# Patient Record
Sex: Male | Born: 1980
Health system: Southern US, Community
[De-identification: ages and names within clinical notes are randomized; demographics above are authoritative.]

## PROBLEM LIST (undated history)

## (undated) ENCOUNTER — Ambulatory Visit: Admission: EM | Payer: BC Managed Care – PPO | Source: Home / Self Care

## (undated) DIAGNOSIS — Z8709 Personal history of other diseases of the respiratory system: Secondary | ICD-10-CM

## (undated) DIAGNOSIS — J302 Other seasonal allergic rhinitis: Secondary | ICD-10-CM

## (undated) DIAGNOSIS — K589 Irritable bowel syndrome without diarrhea: Secondary | ICD-10-CM

## (undated) DIAGNOSIS — K5900 Constipation, unspecified: Secondary | ICD-10-CM

## (undated) DIAGNOSIS — E7800 Pure hypercholesterolemia, unspecified: Secondary | ICD-10-CM

## (undated) DIAGNOSIS — K649 Unspecified hemorrhoids: Secondary | ICD-10-CM

## (undated) DIAGNOSIS — L309 Dermatitis, unspecified: Secondary | ICD-10-CM

## (undated) DIAGNOSIS — E785 Hyperlipidemia, unspecified: Secondary | ICD-10-CM

## (undated) DIAGNOSIS — F988 Other specified behavioral and emotional disorders with onset usually occurring in childhood and adolescence: Secondary | ICD-10-CM

## (undated) HISTORY — DX: Personal history of other diseases of the respiratory system: Z87.09

## (undated) HISTORY — DX: Other specified behavioral and emotional disorders with onset usually occurring in childhood and adolescence: F98.8

## (undated) HISTORY — DX: Other seasonal allergic rhinitis: J30.2

## (undated) HISTORY — DX: Constipation, unspecified: K59.00

## (undated) HISTORY — DX: Pure hypercholesterolemia, unspecified: E78.00

## (undated) HISTORY — DX: Irritable bowel syndrome, unspecified: K58.9

## (undated) HISTORY — DX: Dermatitis, unspecified: L30.9

---
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
## 2002-01-20 ENCOUNTER — Emergency Department (HOSPITAL_COMMUNITY): Admission: EM | Admit: 2002-01-20 | Discharge: 2002-01-20 | Payer: Self-pay | Admitting: Emergency Medicine

## 2004-03-28 ENCOUNTER — Emergency Department (HOSPITAL_COMMUNITY): Admission: EM | Admit: 2004-03-28 | Discharge: 2004-03-28 | Payer: Self-pay | Admitting: Emergency Medicine

## 2004-03-29 ENCOUNTER — Ambulatory Visit (HOSPITAL_COMMUNITY): Admission: RE | Admit: 2004-03-29 | Discharge: 2004-03-29 | Payer: Self-pay | Admitting: Emergency Medicine

## 2004-03-31 ENCOUNTER — Emergency Department (HOSPITAL_COMMUNITY): Admission: EM | Admit: 2004-03-31 | Discharge: 2004-03-31 | Payer: Self-pay | Admitting: Emergency Medicine

## 2010-08-20 ENCOUNTER — Ambulatory Visit: Payer: Self-pay | Admitting: Family Medicine

## 2010-08-20 DIAGNOSIS — J029 Acute pharyngitis, unspecified: Secondary | ICD-10-CM | POA: Insufficient documentation

## 2010-12-31 NOTE — Assessment & Plan Note (Signed)
Summary: NOV: ST   Vital Signs:  Patient profile:   30 year old male Height:      73 inches Weight:      269 pounds BMI:     35.62 Temp:     99.0 degrees F oral BP sitting:   130 / 79  (right arm) Cuff size:   small  Vitals Entered By: Avon Gully CMA, Duncan Dull) (August 20, 2010 11:25 AM)  Contraindications/Deferment of Procedures/Staging:    Test/Procedure: FLU VAX    Reason for deferment: patient declined  CC: sore throat since sat, coughing   CC:  sore throat since sat and coughing.  History of Present Illness: Started with ST about 4 days. Taking halls lozenges but not really helping. Taking nasonex and claritin but not really helping. Not painful to eat or swallow.  This am was painfult to swallow. + fever and hot flashes. No GI sxs. No known sick contacts.  Sinsus pressure and pina.  Hx of allergies. Also tried mucinex adn some post nasal drip. N runny nose. Clear nasal discharge.   Habits & Providers  Alcohol-Tobacco-Diet     Alcohol drinks/day: <1     Tobacco Status: never  Exercise-Depression-Behavior     Does Patient Exercise: no     Type of exercise: running     STD Risk: never     Drug Use: no     Seat Belt Use: always  Current Medications (verified): 1)  Nasonex 50 Mcg/act Susp (Mometasone Furoate) .... One or Two Sprays Each Nostril  Allergies (verified): No Known Drug Allergies  Comments:  Nurse/Medical Assistant: The patient's medications and allergies were reviewed with the patient and were updated in the Medication and Allergy Lists. Avon Gully CMA, Duncan Dull) (August 20, 2010 11:26 AM)  Past History:  Past Medical History: Hx of nasal polyps.   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  HS degree. married to [place] with one daughteer, Zollie Scale.   Never Smoked Alcohol use-yes Drug use-no Regular exercise-no 1-2 caffeinated drinks per day. Smoking Status:   never Does Patient Exercise:  no STD Risk:  never Drug Use:  no Seat Belt Use:  always  Physical Exam  General:  Well-developed,well-nourished,in no acute distress; alert,appropriate and cooperative throughout examination Head:  Normocephalic and atraumatic without obvious abnormalities. No apparent alopecia or balding. Eyes:  No corneal or conjunctival inflammation noted. EOMI. Perrla.  Ears:  External ear exam shows no significant lesions or deformities.  Otoscopic examination reveals clear canals, tympanic membranes are intact bilaterally without bulging, retraction, inflammation or discharge. Hearing is grossly normal bilaterally. Nose:  External nasal examination shows no deformity or inflammation. Nasal mucosa are pink and moist without lesions or exudates. Turbinates are pale and swollen.  Mouth:  Oral mucosa and oropharynx without lesions or exudates.  Teeth in good repair. Neck:  No deformities, masses, or tenderness noted. Lungs:  Normal respiratory effort, chest expands symmetrically. Lungs are clear to auscultation, no crackles or wheezes. Heart:  Normal rate and regular rhythm. S1 and S2 normal without gallop, murmur, click, rub or other extra sounds. Skin:  no rashes.  no rashes.   Cervical Nodes:  No lymphadenopathy noted Psych:  Cognition and judgment appear intact. Alert and cooperative with normal attention span and concentration. No apparent delusions, illusions, hallucinations   Impression & Recommendations:  Problem # 1:  PHARYNGITIS (ICD-462)  Sterp negative. Likely URI. If not better in one week  or getting worse then let me know and will tx for sinusitis since having sinus pain adn pressure. Hx of allergies and  nasal polyps. Can restart your allergy medications. Trial of the magic mouthwash for pain relief. Says his ST is keeping him awake at night.   Orders: Rapid Strep (47829)  Complete Medication List: 1)  Nasonex 50 Mcg/act Susp (Mometasone furoate) ....  One or two sprays each nostril 2)  Magic Mouth Wash.  .... Swish and gargle 5-10 ml  3 x day .  Patient Instructions: 1)  Call if not better by the end of the week.  Prescriptions: MAGIC MOUTH WASH. Swish and gargle 5-10 ml  3 x day .  #120 ml x 0   Entered and Authorized by:   Nani Gasser MD   Signed by:   Nani Gasser MD on 08/20/2010   Method used:   Printed then faxed to ...       [facility] [address] (retail)       [phone]       [place], [place]  [address]       Ph: [phone]       Fax: [phone]   RxID:   [id number]   Laboratory Results  Date/Time Received: 08/20/10 Date/Time Reported: 08/20/10  Other Tests  Rapid Strep: negative

## 2011-11-03 ENCOUNTER — Ambulatory Visit (INDEPENDENT_AMBULATORY_CARE_PROVIDER_SITE_OTHER): Payer: Self-pay | Admitting: Family Medicine

## 2011-11-03 DIAGNOSIS — Z23 Encounter for immunization: Secondary | ICD-10-CM

## 2011-11-03 NOTE — Progress Notes (Signed)
  Subjective:    Patient ID: Devin Osborne, male    DOB: 1981-09-08, 30 y.o.   MRN: [id number]  HPI   Here for flu shot Review of Systems     Objective:   Physical Exam        Assessment & Plan:

## 2012-01-20 ENCOUNTER — Telehealth: Payer: Self-pay | Admitting: *Deleted

## 2012-01-20 NOTE — Telephone Encounter (Signed)
A Rep from [employer] called and states DR. Lesly Rubenstein wants to speak with you in re results. [phone]

## 2012-01-21 NOTE — Telephone Encounter (Signed)
Please call the patient and I did review his oximetry results. Overall he has borderline sleep apnea but doesn't quite meet the definition. At this point time I recommend weight loss and this should improve the snoring. This would certainly get his RDI blood 5 to put him back into the completely normal range. If he is still having some concerns about snoring and we could consider an ear nose and throat evaluation if he would like.

## 2012-02-02 ENCOUNTER — Encounter: Payer: Self-pay | Admitting: Family Medicine

## 2013-03-07 ENCOUNTER — Encounter: Payer: Self-pay | Admitting: Family Medicine

## 2013-03-07 ENCOUNTER — Ambulatory Visit (INDEPENDENT_AMBULATORY_CARE_PROVIDER_SITE_OTHER): Payer: BC Managed Care – PPO | Admitting: Family Medicine

## 2013-03-07 VITALS — BP 126/73 | HR 78 | Temp 98.2°F | Wt 269.0 lb

## 2013-03-07 DIAGNOSIS — Z87898 Personal history of other specified conditions: Secondary | ICD-10-CM

## 2013-03-07 DIAGNOSIS — Z13220 Encounter for screening for lipoid disorders: Secondary | ICD-10-CM

## 2013-03-07 DIAGNOSIS — Z8709 Personal history of other diseases of the respiratory system: Secondary | ICD-10-CM

## 2013-03-07 DIAGNOSIS — J302 Other seasonal allergic rhinitis: Secondary | ICD-10-CM

## 2013-03-07 DIAGNOSIS — L259 Unspecified contact dermatitis, unspecified cause: Secondary | ICD-10-CM

## 2013-03-07 DIAGNOSIS — J309 Allergic rhinitis, unspecified: Secondary | ICD-10-CM

## 2013-03-07 DIAGNOSIS — L309 Dermatitis, unspecified: Secondary | ICD-10-CM | POA: Insufficient documentation

## 2013-03-07 DIAGNOSIS — Z131 Encounter for screening for diabetes mellitus: Secondary | ICD-10-CM

## 2013-03-07 HISTORY — DX: Other seasonal allergic rhinitis: J30.2

## 2013-03-07 HISTORY — DX: Dermatitis, unspecified: L30.9

## 2013-03-07 HISTORY — DX: Personal history of other diseases of the respiratory system: Z87.09

## 2013-03-07 MED ORDER — TRIAMCINOLONE 0.1 % CREAM:EUCERIN CREAM 1:1: 1.0000 "application " | TOPICAL_CREAM | Freq: Two times a day (BID) | CUTANEOUS | Status: DC | PRN

## 2013-03-07 MED ORDER — CETIRIZINE HCL 10 MG PO TABS: 10.0000 mg | ORAL_TABLET | Freq: Every day | ORAL | Status: DC

## 2013-03-07 MED ORDER — MOMETASONE FUROATE 50 MCG/ACT NA SUSP: NASAL | Status: DC

## 2013-03-07 NOTE — Progress Notes (Signed)
CC: Devin Osborne is a 32 y.o. male is here for Allergies   Subjective: HPI:  Patient complains of worsening seasonal allergies. Typically worse  in the spring however the past 2 years symptoms have been present to a mild degree year round. They're now currently of moderate severity. Described as itching eyes, watery eyes, nasal congestion, for head pressure, runny nose. Symptoms are slightly improved with generic Zyrtec. Symptoms are worse when trees start blooming. Symptoms are present on a daily basis. History of seeing ear nose and throat for a polyp in the sinus but lost to followup due to financial reasons.  Patient complains of long-standing eczema. Symptoms seemed to get worse in the winter, he occasionally gets flares other times of the year. He has no symptoms at the current time. He denies rashes or skin discoloration.  Review of Systems - General ROS: negative for - chills, fever, night sweats, weight gain or weight loss Ophthalmic ROS: negative for - decreased vision Psychological ROS: negative for - anxiety or depression ENT ROS: negative for - hearing change or tinnitus Hematological and Lymphatic ROS: negative for - bleeding problems, bruising or swollen lymph nodes Breast ROS: negative Respiratory ROS: no cough, shortness of breath, or wheezing Cardiovascular ROS: no chest pain or dyspnea on exertion Gastrointestinal ROS: no abdominal pain, change in bowel habits, or black or bloody stools Genito-Urinary ROS: negative for - genital discharge, genital ulcers, incontinence or abnormal bleeding from genitals Musculoskeletal ROS: negative for - joint pain or muscle pain Neurological ROS: negative for - headaches or memory loss Dermatological ROS: negative for lumps, mole changes, rash and skin lesion changes  Past Medical History  Diagnosis Date  . Seasonal allergies 03/07/2013  . History of nasal polyp 03/07/2013  . Eczema 03/07/2013     Family History  Problem Relation  Age of Onset  . Hypertension Mother   . Bipolar disorder Mother   . Hyperlipidemia Father   . Asthma Father      History  Substance Use Topics  . Smoking status: Not on file  . Smokeless tobacco: Not on file  . Alcohol Use: Not on file     Objective: Filed Vitals:   03/07/13 1632  BP: 126/73  Pulse: 78  Temp: 98.2 F (36.8 C)    General: Alert and Oriented, No Acute Distress HEENT: Pupils equal, round, reactive to light. Conjunctivae clear.  External ears unremarkable, canals clear with intact TMs with appropriate landmarks.  Middle ear appears open without effusion. Boggy inferior turbinates bilaterally.  Moist mucous membranes, pharynx without inflammation nor lesions.  Neck supple without palpable lymphadenopathy nor abnormal masses. Lungs: Clear to auscultation bilaterally, no wheezing/ronchi/rales.  Comfortable work of breathing. Good air movement. Cardiac: Regular rate and rhythm. Normal S1/S2.  No murmurs, rubs, nor gallops.   Extremities: No peripheral edema.  Strong peripheral pulses.  Mental Status: No depression, anxiety, nor agitation. Skin: Warm and dry. No eczematous changes  Assessment & Plan: Devin Osborne was seen today for allergies.  Diagnoses and associated orders for this visit:  Eczema - Triamcinolone Acetonide (TRIAMCINOLONE 0.1 % CREAM : EUCERIN) CREA; Apply 1 application topically 2 (two) times daily as needed.  Seasonal allergies - mometasone (NASONEX) 50 MCG/ACT nasal spray; Two sprays each nostril daily to prevent allergies. - cetirizine (ZYRTEC ALLERGY) 10 MG tablet; Take 1 tablet (10 mg total) by mouth daily.  History of nasal polyp - mometasone (NASONEX) 50 MCG/ACT nasal spray; Two sprays each nostril daily to prevent allergies.  Lipid  screening - Lipid panel  Diabetes mellitus screening - BASIC METABOLIC PANEL WITH GFR    Seasonal allergies: Uncontrolled, I've encouraged him to start a daily nasal steroid in hopes that this will treat  and prevent possibility that a sinus polyp remains. If allergy symptoms are not improved in 2-3 weeks call back to consider Singulair. Eczema: Controlled, will provide with triamcinolone cream above for future flares. Patient is overdue for cholesterol screening and diabetic screening.  25 minutes spent face-to-face during visit today of which at least 50% was counseling or coordinating care regarding seasonal allergies, eczema, lipid screening, diabetes screening, history of nasal polyp.   Return in about 4 weeks (around 04/04/2013).

## 2013-09-06 ENCOUNTER — Ambulatory Visit (INDEPENDENT_AMBULATORY_CARE_PROVIDER_SITE_OTHER): Payer: BC Managed Care – PPO | Admitting: Family Medicine

## 2013-09-06 ENCOUNTER — Encounter: Payer: Self-pay | Admitting: Family Medicine

## 2013-09-06 VITALS — BP 133/83 | HR 84 | Temp 98.3°F | Wt 276.0 lb

## 2013-09-06 DIAGNOSIS — B9689 Other specified bacterial agents as the cause of diseases classified elsewhere: Secondary | ICD-10-CM

## 2013-09-06 DIAGNOSIS — A499 Bacterial infection, unspecified: Secondary | ICD-10-CM

## 2013-09-06 DIAGNOSIS — J329 Chronic sinusitis, unspecified: Secondary | ICD-10-CM

## 2013-09-06 MED ORDER — AMOXICILLIN-POT CLAVULANATE 500-125 MG PO TABS: ORAL_TABLET | ORAL | Status: AC

## 2013-09-06 NOTE — Progress Notes (Signed)
CC: Devin Osborne is a 32 y.o. male is here for Sinus Problem   Subjective: HPI:  Complains of facial pressure localized to below the eyes moderate severity worse with leaning forward or first thing in the morning. Has been present for a week worsening on a daily basis. Slight improvement with nasal steroid for the first few days. Has been taking A. unknown sinus tablet from work without improvement. Associated with thick nasal discharge sensation of drainage in the back of the throat mild fatigue. Hard to describe headache without any motor or sensory disturbances.  Present on a daily basis all hours of the day. Denies fevers, chills, nausea, vomiting, shortness of breath, cough, wheezing, nor rashes.   Review Of Systems Outlined In HPI  Past Medical History  Diagnosis Date  . Seasonal allergies 03/07/2013  . History of nasal polyp 03/07/2013  . Eczema 03/07/2013     Family History  Problem Relation Age of Onset  . Hypertension Mother   . Bipolar disorder Mother   . Hyperlipidemia Father   . Asthma Father      History  Substance Use Topics  . Smoking status: Never Smoker   . Smokeless tobacco: Not on file  . Alcohol Use: Not on file     Objective: Filed Vitals:   09/06/13 1311  BP: 133/83  Pulse: 84  Temp: 98.3 F (36.8 C)    General: Alert and Oriented, No Acute Distress HEENT: Pupils equal, round, reactive to light. Conjunctivae clear.  External ears unremarkable, left external canal clear with intact tympanic membrane, right external canal requiring water lavage revealing intact unremarkable tympanic membrane.  Middle ear appears open without effusion. Erythematous and boggy inferior turbinates with moderate mucoid discharge.  Moist mucous membranes, pharynx without inflammation nor lesions.  Neck supple without palpable lymphadenopathy nor abnormal masses. Lungs: Clear to auscultation bilaterally, no wheezing/ronchi/rales.  Comfortable work of breathing. Good air  movement. Cardiac: Regular rate and rhythm. Normal S1/S2.  No murmurs, rubs, nor gallops.   Mental Status: No depression, anxiety, nor agitation. Skin: Warm and dry.  Assessment & Plan: Devin Osborne was seen today for sinus problem.  Diagnoses and associated orders for this visit:  Bacterial sinusitis - amoxicillin-clavulanate (AUGMENTIN) 500-125 MG per tablet; Take one by mouth every 8 hours for ten total days.    Bacterial sinusitis: Consider starting Alka-Seltzer cold and sinus, given duration start Augmentin, continue on nasal steroid.Signs and symptoms requring emergent/urgent reevaluation were discussed with the patient.  Return if symptoms worsen or fail to improve.

## 2013-09-19 LAB — BASIC METABOLIC PANEL: Creatinine: 1 mg/dL (ref 0.6–1.3)

## 2013-10-03 ENCOUNTER — Encounter: Payer: Self-pay | Admitting: Family Medicine

## 2013-10-03 DIAGNOSIS — E785 Hyperlipidemia, unspecified: Secondary | ICD-10-CM | POA: Insufficient documentation

## 2013-10-03 HISTORY — DX: Hyperlipidemia, unspecified: E78.5

## 2013-10-14 ENCOUNTER — Encounter: Payer: Self-pay | Admitting: *Deleted

## 2013-11-25 ENCOUNTER — Encounter: Payer: Self-pay | Admitting: *Deleted

## 2014-07-25 ENCOUNTER — Ambulatory Visit (INDEPENDENT_AMBULATORY_CARE_PROVIDER_SITE_OTHER): Payer: 59 | Admitting: Family Medicine

## 2014-07-25 ENCOUNTER — Encounter: Payer: Self-pay | Admitting: Family Medicine

## 2014-07-25 VITALS — BP 141/88 | HR 94 | Temp 98.5°F | Wt 282.0 lb

## 2014-07-25 DIAGNOSIS — J302 Other seasonal allergic rhinitis: Secondary | ICD-10-CM

## 2014-07-25 DIAGNOSIS — A499 Bacterial infection, unspecified: Secondary | ICD-10-CM

## 2014-07-25 DIAGNOSIS — B9689 Other specified bacterial agents as the cause of diseases classified elsewhere: Secondary | ICD-10-CM

## 2014-07-25 DIAGNOSIS — J329 Chronic sinusitis, unspecified: Secondary | ICD-10-CM

## 2014-07-25 DIAGNOSIS — J309 Allergic rhinitis, unspecified: Secondary | ICD-10-CM

## 2014-07-25 MED ORDER — AMOXICILLIN-POT CLAVULANATE 500-125 MG PO TABS: ORAL_TABLET | ORAL | Status: AC

## 2014-07-25 MED ORDER — MONTELUKAST SODIUM 10 MG PO TABS: 10.0000 mg | ORAL_TABLET | Freq: Every day | ORAL | Status: DC

## 2014-07-25 NOTE — Progress Notes (Signed)
CC: Devin Osborne is a 33 y.o. male is here for Sinusitis   Subjective: HPI:  Facial pressure localized to the left forehead that has been present for the last 4 days worsening on a daily basis since the worse first thing in the morning. Accompanied by nasal congestion postnasal drip and nonproductive cough. No benefit from Zyrtec, Nasonex, or over-the-counter cough medication. Subjective fevers and chills  Present for the past 2 days. Fatigue, mild sore throat and mild shortness of breath all have been present as well. No confusion, joint pain, chest pain, nor sneezing.    Review Of Systems Outlined In HPI  Past Medical History  Diagnosis Date  . Seasonal allergies 03/07/2013  . History of nasal polyp 03/07/2013  . Eczema 03/07/2013    No past surgical history on file. Family History  Problem Relation Age of Onset  . Hypertension Mother   . Bipolar disorder Mother   . Hyperlipidemia Father   . Asthma Father     History   Social History  . Marital Status: Married    Spouse Name: N/A    Number of Children: N/A  . Years of Education: N/A   Occupational History  . Not on file.   Social History Main Topics  . Smoking status: Never Smoker   . Smokeless tobacco: Not on file  . Alcohol Use: Not on file  . Drug Use: Not on file  . Sexual Activity: Not on file   Other Topics Concern  . Not on file   Social History Narrative  . No narrative on file     Objective: BP 141/88  Pulse 94  Temp(Src) 98.5 F (36.9 C) (Oral)  Wt 282 lb (127.914 kg)  General: Alert and Oriented, No Acute Distress HEENT: Pupils equal, round, reactive to light. Conjunctivae clear.  External ears unremarkable, canals clear with intact TMs with appropriate landmarks.  Middle ear appears open without effusion. Bony erythematous inferior turbinates moderate mucoid discharge.  Moist mucous membranes, pharynx without inflammation nor lesions.  Neck supple without palpable lymphadenopathy nor abnormal  masses. Lungs: Clear to auscultation bilaterally, no wheezing/ronchi/rales.  Comfortable work of breathing. Good air movement. Cardiac: Regular rate and rhythm. Normal S1/S2.  No murmurs, rubs, nor gallops.   Mental Status: No depression, anxiety, nor agitation. Skin: Warm and dry.  Assessment & Plan: Keeven was seen today for sinusitis.  Diagnoses and associated orders for this visit:  Bacterial sinusitis - amoxicillin-clavulanate (AUGMENTIN) 500-125 MG per tablet; Take one by mouth every 8 hours for ten total days.  Seasonal allergies - montelukast (SINGULAIR) 10 MG tablet; Take 1 tablet (10 mg total) by mouth at bedtime.    Bacterial sinusitis: Start Augmentin, consider use of saline washes and Alka-Seltzer cold and sinus as needed Seasonal allergies: Continue generic Zyrtec, begin montelukast after bacterial sinusitis resolved  Return if symptoms worsen or fail to improve.

## 2015-01-22 ENCOUNTER — Telehealth: Payer: Self-pay

## 2015-01-22 DIAGNOSIS — J302 Other seasonal allergic rhinitis: Secondary | ICD-10-CM

## 2015-01-22 MED ORDER — MONTELUKAST SODIUM 10 MG PO TABS: 10.0000 mg | ORAL_TABLET | Freq: Every day | ORAL | Status: DC

## 2015-01-22 NOTE — Telephone Encounter (Signed)
Patient request refill for Siingulair #[phone] refills sent to [facility]. Adryan Shin,CMA

## 2015-03-01 ENCOUNTER — Other Ambulatory Visit: Payer: Self-pay | Admitting: Family Medicine

## 2015-03-01 DIAGNOSIS — J302 Other seasonal allergic rhinitis: Secondary | ICD-10-CM

## 2015-03-01 MED ORDER — CETIRIZINE HCL 10 MG PO TABS: 10.0000 mg | ORAL_TABLET | Freq: Every day | ORAL | Status: DC

## 2015-06-22 ENCOUNTER — Encounter: Payer: Self-pay | Admitting: Family Medicine

## 2015-06-22 ENCOUNTER — Ambulatory Visit (INDEPENDENT_AMBULATORY_CARE_PROVIDER_SITE_OTHER): Payer: BLUE CROSS/BLUE SHIELD | Admitting: Family Medicine

## 2015-06-22 VITALS — BP 126/72 | HR 88 | Wt 292.0 lb

## 2015-06-22 DIAGNOSIS — J302 Other seasonal allergic rhinitis: Secondary | ICD-10-CM

## 2015-06-22 DIAGNOSIS — T753XXA Motion sickness, initial encounter: Secondary | ICD-10-CM | POA: Diagnosis not present

## 2015-06-22 DIAGNOSIS — L309 Dermatitis, unspecified: Secondary | ICD-10-CM

## 2015-06-22 MED ORDER — SCOPOLAMINE 1 MG/3DAYS TD PT72: 1.0000 | MEDICATED_PATCH | TRANSDERMAL | Status: DC

## 2015-06-22 MED ORDER — MONTELUKAST SODIUM 10 MG PO TABS: 10.0000 mg | ORAL_TABLET | Freq: Every day | ORAL | Status: DC

## 2015-06-22 MED ORDER — TRIAMCINOLONE 0.1 % CREAM:EUCERIN CREAM 1:1
1.0000 | TOPICAL_CREAM | Freq: Two times a day (BID) | CUTANEOUS | Status: DC | PRN
Start: 2015-06-22 — End: 2016-04-22

## 2015-06-22 NOTE — Assessment & Plan Note (Signed)
Prescribe scopolamine for seasickness prophylaxis.

## 2015-06-22 NOTE — Patient Instructions (Signed)
Thank you for coming in today. Motion Sickness Motion sickness is an unpleasant, temporary feeling of dizziness, nausea, and vomiting that occurs when a person is traveling. It can occur during travel by boat, car, airplane, or even on an amusement park ride. The symptoms of motion sickness usually get better once the motion or traveling stops, but problems may persist for hours or days. CAUSES  Motion of the body can cause fluid changes in your inner ear, which can result in motion sickness. Some people are more susceptible to motion sickness than others. Stress, other illnesses, or drinking too much alcohol may add to motion sickness. SYMPTOMS   Nausea.  Dizziness.  Unsteadiness when walking.  Vomiting. DIAGNOSIS  Motion sickness is diagnosed based on your symptoms while you are traveling or moving. TREATMENT  Most people improve rapidly after the motion stops. There are also over-the-counter medicines that can help with motion sickness. Your caregiver can prescribe motion sickness patches. These patches are placed behind your ear. PREVENTION   Avoid situations that cause your motion sickness, if possible.  Consider taking medicines such as meclizine or dimenhydrinate before going on a trip that may cause motion sickness.  Do not eat large meals or drink alcohol before or during travel.  Take small, frequent sips of liquids as needed.  Sit in an area of the airplane or boat with the least motion. On an airplane, sit near the wing. Lie back in your seat, if possible. When riding in a car, try to avoid sitting in the backseat.  Breathe slowly and deeply.  Do not read or focus on nearby objects, if possible, especially if the water or air is rough. However, watching the horizon or a distant object is sometimes helpful, especially in a boat.  Avoid areas where people are smoking, if possible.  Plan ahead for vacations. Your caregiver can help you with a prescription or suggestions  for over-the-counter medicines. HOME CARE INSTRUCTIONS   Only take over-the-counter or prescription medicines as directed by your caregiver.  If you use a motion sickness patch, wash your hands after you put the patch on. Touching your hands to your eyes after using the patch can enlarge (dilate) your pupils for 1 to 2 days and disturb your vision. SEEK MEDICAL CARE IF:   Your vomiting or nausea cannot be controlled with medicine and rest.  You notice blood in your vomit. This could be dark red or look like coffee grounds.  You faint or have severe dizziness or lightheadedness upon standing. These may be signs of dehydration.  You have a fever.  You have severe abdominal or chest pain.  You have trouble breathing.  You have a severe headache.  You develop weakness or numbness on one side of the body.  You have trouble speaking. MAKE SURE YOU:   Understand these instructions.  Will watch your condition.  Will get help right away if you are not doing well or get worse. Document Released: 11/17/2005 Document Revised: 02/09/2012 Document Reviewed: 06/17/2011 [facility] Patient Information 2015 [place], [place]. This information is not intended to replace advice given to you by your health care provider. Make sure you discuss any questions you have with your health care provider.

## 2015-06-22 NOTE — Progress Notes (Signed)
Devin Osborne is a 34 y.o. male who presents to [facility]  today for scopolamine patch. Patient is going on a cruise in the next few weeks and would like a scopolamine patch to prevent seasickness. He currently feels well with no issues. Additionally he notes he needs refills of Singulair and triamcinolone for his eczema and allergies. He denies any significant dry mouth or history of glaucoma.   Past Medical History  Diagnosis Date  . Seasonal allergies 03/07/2013  . History of nasal polyp 03/07/2013  . Eczema 03/07/2013   No past surgical history on file. History  Substance Use Topics  . Smoking status: Never Smoker   . Smokeless tobacco: Not on file  . Alcohol Use: Not on file   ROS as above Medications: Current Outpatient Prescriptions  Medication Sig Dispense Refill  . cetirizine (ZYRTEC ALLERGY) 10 MG tablet Take 1 tablet (10 mg total) by mouth daily. 100 tablet 1  . montelukast (SINGULAIR) 10 MG tablet Take 1 tablet (10 mg total) by mouth at bedtime. 90 tablet 3  . Triamcinolone Acetonide (TRIAMCINOLONE 0.1 % CREAM : EUCERIN) CREA Apply 1 application topically 2 (two) times daily as needed. 1 each 3  . mometasone (NASONEX) 50 MCG/ACT nasal spray Two sprays each nostril daily to prevent allergies. 17 g 2  . scopolamine (TRANSDERM-SCOP, 1.5 MG,) 1 MG/3DAYS Place 1 patch (1.5 mg total) onto the skin every 3 (three) days. 10 patch 12   No current facility-administered medications for this visit.   No Known Allergies   Exam:  BP 126/72 mmHg  Pulse 88  Wt 292 lb (132.45 kg) Gen: Well NAD HEENT: EOMI,  MMM Lungs: Normal work of breathing. CTABL Heart: RRR no MRG Abd: NABS, Soft. Nondistended, Nontender Exts: Brisk capillary refill, warm and well perfused.   No results found for this or any previous visit (from the past 24 hour(s)). No results found.   Please see individual assessment and plan sections.

## 2015-07-09 ENCOUNTER — Other Ambulatory Visit: Payer: Self-pay

## 2015-07-09 DIAGNOSIS — J302 Other seasonal allergic rhinitis: Secondary | ICD-10-CM

## 2015-07-09 MED ORDER — CETIRIZINE HCL 10 MG PO TABS: 10.0000 mg | ORAL_TABLET | Freq: Every day | ORAL | Status: DC

## 2016-01-08 MED FILL — MONTELUKAST SOD 10 MG TAB: 10 | 90 days supply | Qty: 90 | Fill #1

## 2016-01-24 ENCOUNTER — Ambulatory Visit (INDEPENDENT_AMBULATORY_CARE_PROVIDER_SITE_OTHER): Payer: BLUE CROSS/BLUE SHIELD | Admitting: Osteopathic Medicine

## 2016-01-24 ENCOUNTER — Encounter: Payer: Self-pay | Admitting: Osteopathic Medicine

## 2016-01-24 VITALS — BP 144/94 | HR 66 | Ht 73.0 in | Wt 298.0 lb

## 2016-01-24 DIAGNOSIS — J302 Other seasonal allergic rhinitis: Secondary | ICD-10-CM

## 2016-01-24 DIAGNOSIS — J0110 Acute frontal sinusitis, unspecified: Secondary | ICD-10-CM | POA: Diagnosis not present

## 2016-01-24 DIAGNOSIS — Z8709 Personal history of other diseases of the respiratory system: Secondary | ICD-10-CM

## 2016-01-24 DIAGNOSIS — R030 Elevated blood-pressure reading, without diagnosis of hypertension: Secondary | ICD-10-CM

## 2016-01-24 DIAGNOSIS — Z87898 Personal history of other specified conditions: Secondary | ICD-10-CM

## 2016-01-24 DIAGNOSIS — IMO0001 Reserved for inherently not codable concepts without codable children: Secondary | ICD-10-CM

## 2016-01-24 MED ORDER — MOMETASONE FUROATE 50 MCG/ACT NA SUSP: NASAL | Status: DC

## 2016-01-24 MED ORDER — IPRATROPIUM BROMIDE 0.03 % NA SOLN: 2.0000 | Freq: Two times a day (BID) | NASAL | Status: DC

## 2016-01-24 MED ORDER — METHYLPREDNISOLONE 4 MG PO TBPK: ORAL_TABLET | ORAL | Status: DC

## 2016-01-24 MED ORDER — AMOXICILLIN-POT CLAVULANATE 875-125 MG PO TABS
1.0000 | ORAL_TABLET | Freq: Two times a day (BID) | ORAL | Status: DC
Start: 2016-01-24 — End: 2016-04-22

## 2016-01-24 MED FILL — METHYLPREDNISOLONE 4 MG TAB: 4 | 6 days supply | Qty: 21 | Fill #0

## 2016-01-24 MED FILL — IPRATROPIUM 0.03% SPRAY: 0.03 | 44 days supply | Qty: 30 | Fill #0

## 2016-01-24 MED FILL — AMOX-CLAV 875-125 MG TABLET: 875-125 | 5 days supply | Qty: 10 | Fill #0

## 2016-01-24 NOTE — Patient Instructions (Signed)

## 2016-01-24 NOTE — Progress Notes (Signed)
HPI: Devin Osborne is a 35 y.o. male who presents to [facility]  today for chief complaint of:  Chief Complaint  Patient presents with  . Sinus Problem    FACIAL PAIN    . Location: sinuses, jaw . Quality: pain in nasal area and R side of jaw.   . Assoc signs/symptoms:  . Duration: 4 days . Modifying factors: has tried the following OTC/Rx medications: Zyrtec, Singulair, NSAID/Aspirin  without relief . Context: history of severe sinus infections and recurrent infection, Nasonex helps, ran out of this last year sometime (few months ago)    Past medical, social and family history reviewed. Current medications and allergies reviewed.     Review of Systems: CONSTITUTIONAL: subjecitve fever/chills HEAD/EYES/EARS/NOSE/THROAT: yes headache, no vision change or hearing change but blurred vsion when pain was bad, no sore throat CARDIAC: No chest pain/pressure/palpitations, no orthopnea RESPIRATORY: yes cough, no shortness of breath GASTROINTESTINAL: no nausea, no vomiting, no abdominal pain, no blood in stool, no diarrhea MUSCULOSKELETAL: no myalgia/arthralgia   Exam:  BP 144/94 mmHg  Pulse 66  Ht  (1.854 m)  Wt 298 lb (135.172 kg)  BMI 39.32 kg/m2 Constitutional: VSS, see above. General Appearance: alert, well-developed, well-nourished, NAD Eyes: Normal lids and conjunctive, non-icteric sclera, PERRLA Ears, Nose, Mouth, Throat: Normal external inspection ears/nares/mouth/lips/gums, normal TM, MMM;       posterior pharynx without erythema, without exudate, (+) tenderness to palpation maxillary sinuses on R, cheek/jaw  on R, no dental abnormality Neck: No masses, trachea midline. No thyroid enlargement/tenderness/mass appreciated, normal lymph nodes Respiratory: Normal respiratory effort. No  wheeze/rhonchi/rales Cardiovascular: S1/S2 normal, no murmur/rub/gallop auscultated. RRR.    No results found for this or any previous visit  (from the past 72 hour(s)). No results found.  Reviewed records - no CT Sinus report on file.    ASSESSMENT/PLAN:  Acute frontal sinusitis, recurrence not specified - Plan: mometasone (NASONEX) 50 MCG/ACT nasal spray, ipratropium (ATROVENT) 0.03 % nasal spray, methylPREDNISolone (MEDROL DOSEPAK) 4 MG TBPK tablet, amoxicillin-clavulanate (AUGMENTIN) 875-125 MG tablet  Seasonal allergies - Plan: mometasone (NASONEX) 50 MCG/ACT nasal spray  History of nasal polyp - Plan: mometasone (NASONEX) 50 MCG/ACT nasal spray  Elevated blood pressure - in acute illness, folow closely on subsequent visits   Return if symptoms worsen or fail to improve.

## 2016-04-22 ENCOUNTER — Ambulatory Visit (INDEPENDENT_AMBULATORY_CARE_PROVIDER_SITE_OTHER): Payer: BLUE CROSS/BLUE SHIELD | Admitting: Family Medicine

## 2016-04-22 ENCOUNTER — Encounter: Payer: Self-pay | Admitting: Family Medicine

## 2016-04-22 VITALS — BP 142/88 | HR 78 | Ht 73.0 in | Wt 294.0 lb

## 2016-04-22 DIAGNOSIS — E669 Obesity, unspecified: Secondary | ICD-10-CM | POA: Diagnosis not present

## 2016-04-22 DIAGNOSIS — L309 Dermatitis, unspecified: Secondary | ICD-10-CM | POA: Diagnosis not present

## 2016-04-22 DIAGNOSIS — J302 Other seasonal allergic rhinitis: Secondary | ICD-10-CM

## 2016-04-22 MED ORDER — TRIAMCINOLONE 0.1 % CREAM:EUCERIN CREAM 1:1: 1.0000 "application " | TOPICAL_CREAM | Freq: Two times a day (BID) | CUTANEOUS | Status: DC | PRN

## 2016-04-22 MED ORDER — PHENTERMINE HCL 37.5 MG PO TABS: 37.5000 mg | ORAL_TABLET | Freq: Every day | ORAL | Status: DC

## 2016-04-22 MED ORDER — MOMETASONE FUROATE 50 MCG/ACT NA SUSP: NASAL | Status: DC

## 2016-04-22 MED FILL — PHENTERMINE 37.5 MG TABLET: 37.5 | 30 days supply | Qty: 30 | Fill #0

## 2016-04-22 MED FILL — MOMETASONE FUROATE 50 MCG S: 50 | 30 days supply | Qty: 17 | Fill #0

## 2016-04-22 MED FILL — CMPD TAC 0.1%CRM/EUCERIN CR: 30 days supply | Qty: 240 | Fill #1

## 2016-04-22 NOTE — Progress Notes (Signed)
CC: Devin Osborne is a 35 y.o. male is here for Weight Gain   Subjective: HPI:  Follow-up of eczema: Requesting a refill on his triamcinolone cream that is mixed with Eucerin. He uses it occasionally for eczema flares. He denies any decline in the frequency or severity of his outbreaks. He denies any current skin outbreaks. Denies fevers, chills or new rashes.  Follow-up seasonal allergies: He is requesting a refill of Nasonex. He's had increased postnasal drip over the past drip is at its worst. He is taking Singulair and Zyrtec already.  He doesn't he's been dealing with obesity for matter of years now. He's had trouble getting back on his routine when it comes to running and cycling. He's tried his best with diet and exercise but nothing seems to be helping. He wants to know if there is something that he can take to help reduce his appetite.   Review Of Systems Outlined In HPI  Past Medical History  Diagnosis Date  . Seasonal allergies 03/07/2013  . History of nasal polyp 03/07/2013  . Eczema 03/07/2013    No past surgical history on file. Family History  Problem Relation Age of Onset  . Hypertension Mother   . Bipolar disorder Mother   . Hyperlipidemia Father   . Asthma Father     Social History   Social History  . Marital Status: Married    Spouse Name: N/A  . Number of Children: N/A  . Years of Education: N/A   Occupational History  . Not on file.   Social History Main Topics  . Smoking status: Never Smoker   . Smokeless tobacco: Not on file  . Alcohol Use: Not on file  . Drug Use: Not on file  . Sexual Activity: Not on file   Other Topics Concern  . Not on file   Social History Narrative     Objective: BP 142/88 mmHg  Pulse 78  Ht 6\' 1"  (1.854 m)  Wt 294 lb (133.358 kg)  BMI 38.80 kg/m2  General: Alert and Oriented, No Acute Distress HEENT: Pupils equal, round, reactive to light. Conjunctivae clear.  Moist mucous membranes Lungs: Clear to  auscultation bilaterally, no wheezing/ronchi/rales.  Comfortable work of breathing. Good air movement. Cardiac: Regular rate and rhythm. Normal S1/S2.  No murmurs, rubs, nor gallops.   Abdomen: Moderate obesity Extremities: No peripheral edema.  Strong peripheral pulses.  Mental Status: No depression, anxiety, nor agitation. Skin: Warm and dry.  Assessment & Plan: Devin Osborne was seen today for weight gain.  Diagnoses and all orders for this visit:  Eczema -     Triamcinolone Acetonide (TRIAMCINOLONE 0.1 % CREAM : EUCERIN) CREA; Apply 1 application topically 2 (two) times daily as needed.  Seasonal allergies -     mometasone (NASONEX) 50 MCG/ACT nasal spray; Two sprays each nostril daily to prevent allergies.  Obesity  Other orders -     phentermine (ADIPEX-P) 37.5 MG tablet; Take 1 tablet (37.5 mg total) by mouth daily before breakfast.   Eczema: Control with triamcinolone cream and Eucerin Seasonal allergies: Controlled with Nasonex Obesity: Uncontrolled chronic condition, starting phentermine he like to look into transitioning to Contrave once his medication becomes ineffective. Discussed diet and exercise interventions to help further lower his weight.  Return in about 4 weeks (around 05/20/2016) for BP Weight Check.

## 2016-05-14 MED FILL — MONTELUKAST SOD 10 MG TAB: 10 | 90 days supply | Qty: 90 | Fill #2

## 2016-05-20 ENCOUNTER — Ambulatory Visit: Payer: BLUE CROSS/BLUE SHIELD | Admitting: Family Medicine

## 2016-05-26 ENCOUNTER — Ambulatory Visit (INDEPENDENT_AMBULATORY_CARE_PROVIDER_SITE_OTHER): Payer: BLUE CROSS/BLUE SHIELD | Admitting: Family Medicine

## 2016-05-26 VITALS — BP 138/89 | HR 83 | Resp 16 | Wt 278.0 lb

## 2016-05-26 DIAGNOSIS — R635 Abnormal weight gain: Secondary | ICD-10-CM | POA: Diagnosis not present

## 2016-05-26 DIAGNOSIS — R634 Abnormal weight loss: Secondary | ICD-10-CM | POA: Diagnosis not present

## 2016-05-26 MED ORDER — PHENTERMINE HCL 37.5 MG PO TABS: 37.5000 mg | ORAL_TABLET | Freq: Every day | ORAL | Status: DC

## 2016-05-26 MED FILL — PHENTERMINE 37.5 MG TABLET: 37.5 | 30 days supply | Qty: 30 | Fill #0

## 2016-05-26 NOTE — Progress Notes (Signed)
   Subjective:    Patient ID: Devin Osborne, male    DOB: August 23, 1981, 35 y.o.   MRN: [id number]  HPI Patient is here for blood pressure and weight check. Denies trouble sleeping, palpitations, or medication problems.   Review of Systems     Objective:   Physical Exam        Assessment & Plan:  Patient has lost weight. A refill for phentermine will be faxed to pharmacy. Patient advised to schedule a follow up with nurse in 30 days.

## 2016-05-26 NOTE — Progress Notes (Signed)
Weight loss success, refill provided 

## 2016-06-20 ENCOUNTER — Ambulatory Visit (INDEPENDENT_AMBULATORY_CARE_PROVIDER_SITE_OTHER): Payer: BLUE CROSS/BLUE SHIELD | Admitting: Physician Assistant

## 2016-06-20 ENCOUNTER — Encounter: Payer: Self-pay | Admitting: Physician Assistant

## 2016-06-20 VITALS — BP 125/65 | HR 79 | Ht 73.0 in | Wt 276.0 lb

## 2016-06-20 DIAGNOSIS — K5900 Constipation, unspecified: Secondary | ICD-10-CM | POA: Insufficient documentation

## 2016-06-20 DIAGNOSIS — K649 Unspecified hemorrhoids: Secondary | ICD-10-CM

## 2016-06-20 DIAGNOSIS — K645 Perianal venous thrombosis: Secondary | ICD-10-CM | POA: Diagnosis not present

## 2016-06-20 HISTORY — DX: Unspecified hemorrhoids: K64.9

## 2016-06-20 MED ORDER — LIDOCAINE-HYDROCORTISONE ACE 3-0.5 % RE CREA: 1.0000 | TOPICAL_CREAM | Freq: Two times a day (BID) | RECTAL | Status: DC

## 2016-06-20 MED ORDER — HYDROCORTISONE ACETATE 25 MG RE SUPP: 25.0000 mg | Freq: Two times a day (BID) | RECTAL | Status: DC | PRN

## 2016-06-20 NOTE — Progress Notes (Signed)
   Subjective:    Patient ID: Devin Osborne, male    DOB: 02/15/1981, 35 y.o.   MRN: [id number]  HPI  Patient is a 35 year old male who presents to the clinic with painful hard stools and rectal pressure. He started phentermine almost 2 months ago and since then has had decreased bowel movements. His first episode was about 3 weeks after starting phentermine. His wife suggested hemorrhoids were likely causing the pain and he did sitz baths and use over-the-counter steroid cream. Symptoms seem to resolve for a few weeks and then come back. Yesterday he had a lot of pain and pressure rectally and seemed to radiate into testicles. He took a stool softener and a laxative and had a few good soft bowel movements. The pain seemed to resolve. Today he has little to know discomfort.  He noted blood 1 with a bowel movement. He denies any ongoing abdominal pain. He denies any nausea. 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 He has lost over 20 pounds on the phentermine.    Review of Systems See HPI.     Objective:   Physical Exam  Constitutional: He is oriented to person, place, and time. He appears well-developed and well-nourished.  HENT:  Head: Normocephalic and atraumatic.  Genitourinary: Guaiac negative stool.     Neurological: He is alert and oriented to person, place, and time.  Psychiatric: He has a normal mood and affect. His behavior is normal.          Assessment & Plan:  Constipation/hemorrhoid- I do think the constipation is likely coming from a side effect of phentermine. Patient is hesitant to decrease or stop phentermine because he has done so well on this medication. Discuss he can try to treat with MiraLAX daily or every other day as needed for constipation and see if that helps him use the bathroom regularly. If this does not suggest him cutting phentermine dose in half. Discussed sits baths and rectal cream. I did send over a cream to use  that have steroid in it that can help with hemorrhoid. If not improving he may certainly can follow-up.

## 2016-06-20 NOTE — Patient Instructions (Signed)

## 2016-06-21 MED ORDER — HYDROCORTISONE ACETATE 25 MG RE SUPP: 25.0000 mg | Freq: Two times a day (BID) | RECTAL | Status: DC | PRN

## 2016-06-21 NOTE — Addendum Note (Signed)
Addended byJomarie Longs on: 06/21/2016 12:40 PM   Modules accepted: Orders

## 2016-06-23 ENCOUNTER — Other Ambulatory Visit: Payer: Self-pay | Admitting: Physician Assistant

## 2016-06-23 ENCOUNTER — Ambulatory Visit (INDEPENDENT_AMBULATORY_CARE_PROVIDER_SITE_OTHER): Payer: BLUE CROSS/BLUE SHIELD | Admitting: Physician Assistant

## 2016-06-23 ENCOUNTER — Encounter: Payer: Self-pay | Admitting: Physician Assistant

## 2016-06-23 VITALS — BP 142/78 | HR 82 | Ht 73.0 in | Wt 276.0 lb

## 2016-06-23 DIAGNOSIS — K645 Perianal venous thrombosis: Secondary | ICD-10-CM | POA: Insufficient documentation

## 2016-06-23 MED ORDER — HYDROCODONE-ACETAMINOPHEN 5-325 MG PO TABS: 1.0000 | ORAL_TABLET | Freq: Three times a day (TID) | ORAL | 0 refills | Status: DC | PRN

## 2016-06-23 MED FILL — HYDROCODON-APAP 5-325: 5-325 | 6 days supply | Qty: 20 | Fill #0

## 2016-06-23 NOTE — Progress Notes (Signed)
   Subjective:    Patient ID: Devin Osborne, male    DOB: 12-15-1980, 35 y.o.   MRN: [id number]  HPI Pt is a 35 yo male who presents to the clinic with worsening hemorrhoid pain. He was seen last Friday. He was treated with miralax, cream, suppository, sitz baths, and colace. He was not able to get cream or suppository due to cost. He forgot to get miralax. He feels like pain has worsened. They only thing that does help is sitz baths and ibuprofen.    Review of Systems  All other systems reviewed and are negative.      Objective:   Physical Exam  Constitutional: He appears well-developed and well-nourished.  Genitourinary:             Assessment & Plan:  thrombosed hemorrhoid- Lanced hemorrhoid today in office. Prepped with lidocaine 2 percent without epi approximately 4cc. 11 blade used to open site of drainage. Tweesers used to debride area and break up any clots. Pt aware to keep area clean.  Witch hazel tuck pads.  Sitz baths 3-4 times a day.  Sent cream to use bid.  norco if needed. Ibuprofen 800mg .  STOP phentermine until constipation improves.  START miralax and colace to help with bowel movements.

## 2016-06-23 NOTE — Patient Instructions (Signed)
Witch hazel tuck pads.  Sitz baths 3-4 times a day.  Sent cream to use bid.  norco if needed. Ibuprofen 800mg .

## 2016-11-14 ENCOUNTER — Other Ambulatory Visit: Payer: Self-pay

## 2016-11-14 DIAGNOSIS — J302 Other seasonal allergic rhinitis: Secondary | ICD-10-CM

## 2016-11-14 MED ORDER — MONTELUKAST SODIUM 10 MG PO TABS: 10.0000 mg | ORAL_TABLET | Freq: Every day | ORAL | 3 refills | Status: DC

## 2016-11-14 MED FILL — MONTELUKAST SOD 10 MG TAB: 10 | 90 days supply | Qty: 90 | Fill #0 | Status: TO

## 2016-11-14 NOTE — Telephone Encounter (Signed)
Patient request refill for Singular 10 mg. #[phone] refills sent to patient [facility]. Angeli Demilio,CMA

## 2017-01-07 ENCOUNTER — Other Ambulatory Visit: Payer: Self-pay | Admitting: *Deleted

## 2017-01-07 MED ORDER — OSELTAMIVIR PHOSPHATE 75 MG PO CAPS: 75.0000 mg | ORAL_CAPSULE | Freq: Every day | ORAL | 0 refills | Status: AC

## 2017-01-07 MED FILL — OSELTAMIVIR PHOS 75 MG CAP: 75 | 10 days supply | Qty: 10 | Fill #0

## 2017-05-20 ENCOUNTER — Other Ambulatory Visit: Payer: Self-pay | Admitting: *Deleted

## 2017-05-20 MED ORDER — MONTELUKAST SODIUM 10 MG PO TABS: 10.0000 mg | ORAL_TABLET | Freq: Every day | ORAL | 3 refills | Status: DC

## 2017-05-20 MED FILL — MONTELUKAST SOD 10 MG TAB: 10 | 90 days supply | Qty: 90 | Fill #0

## 2017-09-14 MED FILL — MONTELUKAST SOD 10 MG TAB: 10 | 90 days supply | Qty: 90 | Fill #1

## 2017-12-16 ENCOUNTER — Encounter: Payer: Self-pay | Admitting: Physician Assistant

## 2017-12-16 ENCOUNTER — Ambulatory Visit (INDEPENDENT_AMBULATORY_CARE_PROVIDER_SITE_OTHER): Payer: No Typology Code available for payment source | Admitting: Physician Assistant

## 2017-12-16 VITALS — BP 141/82 | HR 85 | Ht 72.0 in | Wt 293.0 lb

## 2017-12-16 DIAGNOSIS — G478 Other sleep disorders: Secondary | ICD-10-CM | POA: Diagnosis not present

## 2017-12-16 DIAGNOSIS — Z6839 Body mass index (BMI) 39.0-39.9, adult: Secondary | ICD-10-CM

## 2017-12-16 DIAGNOSIS — E6609 Other obesity due to excess calories: Secondary | ICD-10-CM

## 2017-12-16 DIAGNOSIS — R0683 Snoring: Secondary | ICD-10-CM

## 2017-12-16 MED ORDER — PHENTERMINE HCL 37.5 MG PO TABS: 37.5000 mg | ORAL_TABLET | Freq: Every day | ORAL | 0 refills | Status: DC

## 2017-12-16 NOTE — Progress Notes (Signed)
   Subjective:    Patient ID: Devin Osborne, male    DOB: [birth year], 37 y.o.   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 Pt admits he feels like he needs to lose weight. He tried phentermine with great success in 2017. He would like to try again. He admits to snoring and waking up not feeling rested at times. He goes to sleep fine and sleeps "ok". He is not currently exercising or eating like he should.   .. Active Ambulatory Problems    Diagnosis Date Noted  . PHARYNGITIS 08/20/2010  . History of nasal polyp 03/07/2013  . Seasonal allergies 03/07/2013  . Eczema 03/07/2013  . Hyperlipidemia 10/03/2013  . Seasickness 06/22/2015  . Constipation 06/20/2016  . Hemorrhoid 06/20/2016  . Thrombosed external hemorrhoid 06/23/2016  . Non-restorative sleep 12/17/2017  . Snoring 12/17/2017   Resolved Ambulatory Problems    Diagnosis Date Noted  . No Resolved Ambulatory Problems   Past Medical History:  Diagnosis Date  . Eczema 03/07/2013  . History of nasal polyp 03/07/2013  . Seasonal allergies 03/07/2013     Review of Systems  All other systems reviewed and are negative.      Objective:   Physical Exam  Constitutional: He is oriented to person, place, and time. He appears well-developed and well-nourished.  Obese.   HENT:  Head: Normocephalic and atraumatic.  Cardiovascular: Normal rate, regular rhythm and normal heart sounds.  Pulmonary/Chest: Effort normal and breath sounds normal. He has no wheezes.  Neurological: He is alert and oriented to person, place, and time.  Psychiatric: He has a normal mood and affect. His behavior is normal.          Assessment & Plan:  Marland Kitchen.Marland Kitchen.Devin Osborne was seen today for obesity and snoring.  Diagnoses and all orders for this visit:  Non-restorative sleep -     Split night study  Class 2 obesity due to  excess calories without serious comorbidity with body mass index (BMI) of 39.0 to 39.9 in adult -     Split night study -     phentermine (ADIPEX-P) 37.5 MG tablet; Take 1 tablet (37.5 mg total) by mouth daily before breakfast.  Snoring -     Split night study   Pt declined fasting labs today.   Sleep study ordered. Discussed even if he has apnea. Weight loss can reverse OSA.  STOP BANG high risk.   Marland Kitchen..Discussed low carb diet with 1500 calories and 80g of protein.  Exercising at least 150 minutes a week.  My Fitness Pal could be a [employer] resource.  Phentermine restarted. Discussed side effects. Pt did struggle with constipation the first time he was on it. Discussed stool softeners and miralax as needed. Keep hydrated.  Follow up nurse visit in 2 months.

## 2017-12-17 ENCOUNTER — Encounter: Payer: Self-pay | Admitting: Physician Assistant

## 2017-12-17 DIAGNOSIS — Z6839 Body mass index (BMI) 39.0-39.9, adult: Secondary | ICD-10-CM

## 2017-12-17 DIAGNOSIS — G478 Other sleep disorders: Secondary | ICD-10-CM | POA: Insufficient documentation

## 2017-12-17 DIAGNOSIS — Z6838 Body mass index (BMI) 38.0-38.9, adult: Secondary | ICD-10-CM | POA: Insufficient documentation

## 2017-12-17 DIAGNOSIS — E6609 Other obesity due to excess calories: Secondary | ICD-10-CM | POA: Insufficient documentation

## 2017-12-17 DIAGNOSIS — R0683 Snoring: Secondary | ICD-10-CM | POA: Insufficient documentation

## 2017-12-18 ENCOUNTER — Other Ambulatory Visit: Payer: Self-pay | Admitting: Physician Assistant

## 2017-12-18 MED ORDER — LORCASERIN HCL ER 20 MG PO TB24: 1.0000 | ORAL_TABLET | Freq: Every day | ORAL | 2 refills | Status: DC

## 2017-12-18 NOTE — Progress Notes (Signed)
Does not want to do phentermine. Concerned about elevated blood pressure with phentermine. Will do belviq.

## 2017-12-22 ENCOUNTER — Telehealth: Payer: Self-pay | Admitting: Physician Assistant

## 2017-12-22 NOTE — Telephone Encounter (Signed)
Received fax for PA on Belviq sent through cover my meds waiting on determination. - CF °

## 2017-12-25 NOTE — Telephone Encounter (Signed)
Received Faxed form from [employer] filled out and faxed now waiting on determination. - CF

## 2017-12-29 NOTE — Telephone Encounter (Signed)
Received fax from [employer] and they denied coverage on Belviq due to patient not actively being enrolled in an exercise and caloric reduction program. - CF  PA Reference #: [id number]

## 2017-12-29 NOTE — Telephone Encounter (Signed)
Call pt: let him kno .Marland Kitchen.Discussed low carb diet with 1500 calories and 80g of protein.  Exercising at least 150 minutes a week.  My Fitness Pal could be a [employer] resource.   Follow up nurse visit in 6-8 weeks if no weight loss or limited will then resubmit.

## 2017-12-30 ENCOUNTER — Telehealth: Payer: Self-pay | Admitting: Physician Assistant

## 2017-12-30 MED ORDER — OSELTAMIVIR PHOSPHATE 75 MG PO CAPS: 75.0000 mg | ORAL_CAPSULE | Freq: Every day | ORAL | 0 refills | Status: DC

## 2017-12-30 MED FILL — OSELTAMIVIR PHOSPHATE 75 MG: 75 | 5 days supply | Qty: 10 | Fill #0

## 2017-12-30 NOTE — Telephone Encounter (Signed)
Devin Osborne     I called patient and he was asking if we could just try a different medication like the one he took in the past because he knows that one did not require a prior authorization. - CF

## 2017-12-30 NOTE — Telephone Encounter (Signed)
I sent phentermine initially before you called back and asked for belviq. You should still have rx. That is the only low cost weight loss drug we have. I think andrea was concerned about increasing your BP and other symptoms.

## 2017-12-30 NOTE — Telephone Encounter (Signed)
Pt's daughter tested positive for flu, requesting tamiflu be sent to [facility]. Rx sent.  

## 2017-12-31 NOTE — Telephone Encounter (Signed)
Patient has been notified and is going to locate his Rx. - CF

## 2018-01-10 ENCOUNTER — Encounter (HOSPITAL_BASED_OUTPATIENT_CLINIC_OR_DEPARTMENT_OTHER): Payer: No Typology Code available for payment source

## 2018-02-02 ENCOUNTER — Ambulatory Visit (INDEPENDENT_AMBULATORY_CARE_PROVIDER_SITE_OTHER): Payer: Self-pay | Admitting: Emergency Medicine

## 2018-02-02 VITALS — BP 135/90 | HR 77 | Temp 98.7°F | Resp 16 | Wt 286.8 lb

## 2018-02-02 DIAGNOSIS — J019 Acute sinusitis, unspecified: Secondary | ICD-10-CM

## 2018-02-02 MED ORDER — AMOXICILLIN-POT CLAVULANATE 875-125 MG PO TABS: 1.0000 | ORAL_TABLET | Freq: Two times a day (BID) | ORAL | 0 refills | Status: DC

## 2018-02-02 NOTE — Patient Instructions (Signed)

## 2018-02-02 NOTE — Progress Notes (Signed)
Subjective:     Devin Osborne is a 37 y.o. male who presents for evaluation of sinus pain. Symptoms include: congestion, facial pain, foul rhinorrhea, nasal congestion, sinus pressure and tooth pain. Onset of symptoms was 2 weeks ago. Symptoms have been gradually worsening since that time. Past history is significant for no history of pneumonia or bronchitis. Patient is a non-smoker.     Review of Systems Pertinent items noted in HPI and remainder of comprehensive ROS otherwise negative.   Objective:   Vitals:   02/02/18 1840  BP: 135/90  Pulse: 77  Resp: 16  Temp: 98.7 F (37.1 C)  SpO2: 95%   Physical Exam  HENT:  Head: Normocephalic and atraumatic.  Right Ear: Tympanic membrane normal.  Left Ear: Tympanic membrane normal.  Nose: Left sinus exhibits maxillary sinus tenderness and frontal sinus tenderness.  Mouth/Throat: Uvula is midline and oropharynx is clear and moist.  Eyes: Conjunctivae are normal.  Neck: Normal range of motion.  Cardiovascular: Normal rate and regular rhythm.  Pulmonary/Chest: Effort normal and breath sounds normal.  Lymphadenopathy:    He has no cervical adenopathy.  Neurological: He is alert.  Skin: Skin is warm and dry. Capillary refill takes less than 2 seconds.  Nursing note and vitals reviewed.    Assessment:    Acute bacterial sinusitis.    Plan:    Nasal saline sprays. Nasal steroids per medication orders. Augmentin per medication orders. Follow up in 1 week or as needed.

## 2018-02-05 ENCOUNTER — Telehealth: Payer: Self-pay

## 2018-02-05 NOTE — Telephone Encounter (Signed)
Pt returned call and states he is feeling better.

## 2018-02-07 ENCOUNTER — Ambulatory Visit (INDEPENDENT_AMBULATORY_CARE_PROVIDER_SITE_OTHER): Payer: Self-pay | Admitting: Emergency Medicine

## 2018-02-07 VITALS — BP 120/78 | HR 110 | Temp 100.2°F | Resp 17

## 2018-02-07 DIAGNOSIS — J101 Influenza due to other identified influenza virus with other respiratory manifestations: Secondary | ICD-10-CM

## 2018-02-07 MED ORDER — OSELTAMIVIR PHOSPHATE 75 MG PO CAPS: 75.0000 mg | ORAL_CAPSULE | Freq: Two times a day (BID) | ORAL | 0 refills | Status: DC

## 2018-02-07 NOTE — Progress Notes (Signed)
S: Margart SicklesDerrick S Braddock is a 37 y.o. male who presents for reevaluation of symptoms. Was seen 6 days ago, diagnosed with sinusitis following to weeks of facial pain, pressure and congestion. Started on Augmentin, reports some improvement in symptoms from that visit. Reports worsening symptoms over previous 48 hours to include fever, myalgias, worsening facial pain, and generalized weakness and fatigue.    Review of Systems  Constitutional: Positive for chills, fever and malaise/fatigue.  HENT: Positive for congestion, sinus pain and sore throat. Negative for ear pain.   Respiratory: Positive for cough. Negative for shortness of breath and wheezing.   Cardiovascular: Negative.   Gastrointestinal: Negative.   Musculoskeletal: Positive for myalgias.  Skin: Negative.   Neurological: Negative.    O: Vitals:   02/07/18 1310  BP: 120/78  Pulse: (!) 110  Resp: 17  Temp: 100.2 F (37.9 C)  SpO2: 96%   Physical Exam  Constitutional: He appears well-developed and well-nourished. He appears ill. No distress.  HENT:  Head: Normocephalic and atraumatic.  Right Ear: Tympanic membrane normal.  Left Ear: Tympanic membrane normal.  Nose: Left sinus exhibits maxillary sinus tenderness and frontal sinus tenderness.  Mouth/Throat: Uvula is midline and oropharynx is clear and moist.  Eyes: Conjunctivae are normal.  Cardiovascular: Regular rhythm. Tachycardia present.  Pulmonary/Chest: Effort normal and breath sounds normal.  Lymphadenopathy:    He has cervical adenopathy.  Neurological: He is alert.  Skin: Skin is warm and dry. Capillary refill takes less than 2 seconds. He is not diaphoretic.  Nursing note and vitals reviewed.   A: 1. Influenza A     P: Finish course of Augmentin as I believe last visit was still acute sinuitis. Tested positive for influenza A, symptoms within 48 hours, will start Tamiflu. OTC therapies for symptom management. Return in one week for reevaluation if symptoms  continue.

## 2018-02-07 NOTE — Patient Instructions (Signed)

## 2018-02-09 ENCOUNTER — Telehealth: Payer: Self-pay | Admitting: Emergency Medicine

## 2018-02-09 NOTE — Telephone Encounter (Signed)
Patient stated he is not feeling much better but he is feeling  Little better and he will continue to take the meds  And get some sleep he thanked me for the call and stated he would stay hydrated

## 2018-02-19 MED FILL — MONTELUKAST SOD 10 MG TAB: 10 | 90 days supply | Qty: 90 | Fill #2

## 2018-03-23 ENCOUNTER — Ambulatory Visit (INDEPENDENT_AMBULATORY_CARE_PROVIDER_SITE_OTHER): Payer: No Typology Code available for payment source | Admitting: Family Medicine

## 2018-03-23 ENCOUNTER — Encounter: Payer: Self-pay | Admitting: Family Medicine

## 2018-03-23 VITALS — BP 136/81 | HR 83 | Ht 72.0 in | Wt 280.0 lb

## 2018-03-23 DIAGNOSIS — M545 Low back pain, unspecified: Secondary | ICD-10-CM

## 2018-03-23 MED ORDER — IBUPROFEN 600 MG PO TABS: 600.0000 mg | ORAL_TABLET | Freq: Three times a day (TID) | ORAL | 0 refills | Status: DC | PRN

## 2018-03-23 MED ORDER — CYCLOBENZAPRINE HCL 10 MG PO TABS: 10.0000 mg | ORAL_TABLET | Freq: Every evening | ORAL | 0 refills | Status: DC | PRN

## 2018-03-23 MED FILL — CYCLOBENZAPRINE 10 MG TAB: 10 | 20 days supply | Qty: 20 | Fill #0

## 2018-03-23 MED FILL — IBUPROFEN 600 MG TABLET: 600 | 10 days supply | Qty: 30 | Fill #0

## 2018-03-23 NOTE — Patient Instructions (Signed)
Please start ibuprofen 600 mg 3 times a day.  Make sure to take it with a little food and water.  Stop immediately if any stomach upset or irritation.  Recommend taking this for 5 days.  At that point if you are feeling better then start to space the medication. Not improving over the next couple of weeks with the stretches and home exercises please let us know.

## 2018-03-23 NOTE — Progress Notes (Signed)
Subjective:    Patient ID: Devin Osborne, male    DOB: [birth year], 37 y.o.   MRN: [id number]  HPI  started over the weekend. pain is worse on L lower side. 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 he reports that if he moves a certian way, coughs, laugh, or lifts his L arm this makes the pain worse. this pain got more severe over the last 24 hours, last night he stated that he did experience some sweats and chills, but not fever. No sig nausea.  Denies any hematuria.   Review of Systems  BP 136/81   Pulse 83   Ht 6' (1.829 m)   Wt 280 lb (127 kg)   SpO2 97%   BMI 37.97 kg/m     No Known Allergies  Past Medical History:  Diagnosis Date  . Eczema 03/07/2013  . History of nasal polyp 03/07/2013  . Seasonal allergies 03/07/2013    No past surgical history on file.  Social History   Socioeconomic History  . Marital status: Married    Spouse name: Not on file  . Number of children: Not on file  . Years of education: Not on file  . Highest education level: Not on file  Occupational History  . Not on file  Social Needs  . Financial resource strain: Not on file  . Food insecurity:    Worry: Not on file    Inability: Not on file  . Transportation needs:    Medical: Not on file    Non-medical: Not on file  Tobacco Use  . Smoking status: Never Smoker  . Smokeless tobacco: Never Used  Substance and Sexual Activity  . Alcohol use: No    Frequency: Never  . Drug use: No  . Sexual activity: Yes  Lifestyle  . Physical activity:    Days per week: Not on file    Minutes per session: Not on file  . Stress: Not on file  Relationships  . Social connections:    Talks on phone: Not on file    Gets together: Not on file    Attends religious service: Not on file    Active member of club or organization: Not on file    Attends meetings of clubs or organizations: Not on file    Relationship status: Not on file  . Intimate partner violence:     Fear of current or ex partner: Not on file    Emotionally abused: Not on file    Physically abused: Not on file    Forced sexual activity: Not on file  Other Topics Concern  . Not on file  Social History Narrative  . Not on file    Family History  Problem Relation Age of Onset  . Hypertension Mother   . Bipolar disorder Mother   . Hyperlipidemia Father   . Asthma Father     Outpatient Encounter Medications as of 03/23/2018  Medication Sig  . cetirizine (ZYRTEC ALLERGY) 10 MG tablet Take 1 tablet (10 mg total) by mouth daily.  . montelukast (SINGULAIR) 10 MG tablet Take 1 tablet (10 mg total) by mouth at bedtime.  . [DISCONTINUED] amoxicillin-clavulanate (AUGMENTIN) 875-125 MG tablet Take 1 tablet by mouth 2 (two) times daily.  . [DISCONTINUED] Lorcaserin HCl ER (BELVIQ XR) 20 MG TB24 Take 1 tablet by mouth daily.  . [DISCONTINUED] mometasone (NASONEX) 50 MCG/ACT nasal spray Two sprays each nostril daily to  prevent allergies.  . [DISCONTINUED] oseltamivir (TAMIFLU) 75 MG capsule Take 1 capsule (75 mg total) by mouth 2 (two) times daily.  . [DISCONTINUED] phentermine (ADIPEX-P) 37.5 MG tablet Take 1 tablet (37.5 mg total) by mouth daily before breakfast.  . cyclobenzaprine (FLEXERIL) 10 MG tablet Take 1 tablet (10 mg total) by mouth at bedtime as needed for muscle spasms.  Marland Kitchen. ibuprofen (ADVIL,MOTRIN) 600 MG tablet Take 1 tablet (600 mg total) by mouth every 8 (eight) hours as needed.   No facility-administered encounter medications on file as of 03/23/2018.           Objective:   Physical Exam  Constitutional: He is oriented to person, place, and time. He appears well-developed and well-nourished.  HENT:  Head: Normocephalic and atraumatic.  Eyes: Conjunctivae and EOM are normal.  Cardiovascular: Normal rate.  Pulmonary/Chest: Effort normal.  Musculoskeletal:  Significantly decreased lumbar flexion.  Normal extension, rotation and sidebending failure.  That he did have  pain with bending to the left.  In fact he only flex about 15 to 20 degrees and then started to get a spasm in his back.  Nontender over the lumbar spine or thoracic spine.  He is tender over the left SI joint.  Negative straight leg raise bilaterally.  Patellar reflex 1+ bilaterally.  It, knee, ankle strength is 5 out of 5 bilaterally.  Neurological: He is alert and oriented to person, place, and time.  Skin: Skin is dry. No pallor.  Psychiatric: He has a normal mood and affect. His behavior is normal.  Vitals reviewed.         Assessment & Plan:  Acute left-sided low back pain without sciatica-discussed diagnosis.  Most consistent with musculoskeletal strain.  I do not see any warning signs or symptoms of other causes such as kidney problems infection etc.  Work on stretches.  Prescription sent for anti-inflammatory to take for about 5 days.  Stop immediately if any GI upset or irritation.  Also send over muscle relaxer for him to take once he is home in the evenings.  If not improving over the next week or 2 then please let us know.  Given work note for today.  He is also planning on getting an over-the-counter back brace for extra support on days where he has to do heavy lifting with his job.

## 2018-05-12 MED FILL — MONTELUKAST SOD 10 MG TAB: 10 | 90 days supply | Qty: 90 | Fill #3

## 2018-08-24 ENCOUNTER — Ambulatory Visit (INDEPENDENT_AMBULATORY_CARE_PROVIDER_SITE_OTHER): Payer: Self-pay | Admitting: Family Medicine

## 2018-08-24 VITALS — BP 130/84 | HR 89 | Temp 99.2°F | Resp 18 | Wt 294.8 lb

## 2018-08-24 DIAGNOSIS — R109 Unspecified abdominal pain: Secondary | ICD-10-CM

## 2018-08-24 DIAGNOSIS — M62838 Other muscle spasm: Secondary | ICD-10-CM

## 2018-08-24 LAB — POC URINALSYSI DIPSTICK (AUTOMATED)
Bilirubin, UA: NEGATIVE
Blood, UA: NEGATIVE
GLUCOSE UA: NEGATIVE
KETONES UA: NEGATIVE
LEUKOCYTES UA: NEGATIVE
PROTEIN UA: POSITIVE — AB
SPEC GRAV UA: 1.015 (ref 1.010–1.025)
Urobilinogen, UA: 0.2 E.U./dL
pH, UA: 5 (ref 5.0–8.0)

## 2018-08-24 MED ORDER — CYCLOBENZAPRINE HCL 10 MG PO TABS: 10.0000 mg | ORAL_TABLET | Freq: Three times a day (TID) | ORAL | 0 refills | Status: DC | PRN

## 2018-08-24 MED FILL — CYCLOBENZAPRINE HCL 10 MG T: 10 | 4 days supply | Qty: 10 | Fill #0

## 2018-08-24 NOTE — Patient Instructions (Addendum)
PLAN< Use medication as directed for the next 48-72 hours if Not improved please seek care with PCP for Comprehensive evaluation  Muscle Cramps and Spasms Muscle cramps and spasms are when muscles tighten by themselves. They usually get better within minutes. Muscle cramps are painful. They are usually stronger and last longer than muscle spasms. Muscle spasms may or may not be painful. They can last a few seconds or much longer. Follow these instructions at home:  Drink enough fluid to keep your pee (urine) clear or pale yellow.  Massage, stretch, and relax the muscle.  If directed, apply heat to tight or tense muscles as often as told by your doctor. Use the heat source that your doctor recommends. ? Place a towel between your skin and the heat source. ? Leave the heat on for 20-30 minutes. ? Take off the heat if your skin turns bright red. This is especially important if you are unable to feel pain, heat, or cold. You may have a greater risk of getting burned.  If directed, put ice on the affected area. This may help if you are sore or have pain after a cramp or spasm. ? Put ice in a plastic bag. ? Place a towel between your skin and the bag. ? Leave the ice on for 20 minutes, 2-3 times a day.  Take over-the-counter and prescription medicines only as told by your doctor.  Pay attention to any changes in your symptoms. Contact a doctor if:  Your cramps or spasms get worse or happen more often.  Your cramps or spasms do not get better with time. This information is not intended to replace advice given to you by your health care provider. Make sure you discuss any questions you have with your health care provider. Document Released: 10/30/2008 Document Revised: 12/19/2015 Document Reviewed: 08/21/2015 Elsevier Interactive Patient Education  2018 [employer]

## 2018-08-24 NOTE — Progress Notes (Signed)
Devin Osborne is a 37 y.o. male who presents today with concerns of flank pain for the last 2 days. 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 He reports intermittent pain that is exacerbated when patient makes certain movements. He denies any known trauma acute or historic, he denies any known kidney issues or other chronic health conditions. Of note he was seen earlier this year in April for acute back pain left side without sciatica by his primary care provider. He reports that the treatment at that time was effective and that he completed the entire treatment at that time.  Review of Systems  Constitutional: Negative for chills, fever and malaise/fatigue.  HENT: Negative for congestion, ear discharge, ear pain, sinus pain and sore throat.   Eyes: Negative.   Respiratory: Negative for cough, sputum production and shortness of breath.   Cardiovascular: Negative.  Negative for chest pain.  Gastrointestinal: Positive for abdominal pain. Negative for diarrhea, nausea and vomiting.  Genitourinary: Negative for dysuria, frequency, hematuria and urgency.  Musculoskeletal: Positive for back pain. Negative for myalgias.  Skin: Negative.   Neurological: Negative for headaches.  Endo/Heme/Allergies: Negative.   Psychiatric/Behavioral: Negative.     O: Vitals:   08/24/18 1336  BP: 130/84  Pulse: 89  Resp: 18  Temp: 99.2 F (37.3 C)  SpO2: 96%     Physical Exam  Constitutional: He is oriented to person, place, and time. Vital signs are normal. He appears well-developed and well-nourished. He is active.  Non-toxic appearance. He does not have a sickly appearance.  HENT:  Head: Normocephalic.  Right Ear: Hearing, tympanic membrane, external ear and ear canal normal.  Left Ear: Hearing, tympanic membrane, external ear and  ear canal normal.  Nose: Nose normal.  Mouth/Throat: Uvula is midline and oropharynx is clear and moist.  Neck: Normal range of motion. Neck supple.  Cardiovascular: Normal rate, regular rhythm, normal heart sounds and normal pulses.  Pulmonary/Chest: Effort normal and breath sounds normal.  Abdominal: Soft. Bowel sounds are normal. He exhibits no shifting dullness, no distension, no pulsatile liver, no fluid wave, no abdominal bruit, no ascites, no pulsatile midline mass and no mass. There is no tenderness. There is no rigidity, no rebound, no guarding, no CVA tenderness, no tenderness at McBurney's point and negative Murphy's sign.  Complete abdominal exam completed no pain or discomfort found on exam  Musculoskeletal: Normal range of motion.       Lumbar back: He exhibits normal range of motion, no tenderness, no bony tenderness, no swelling, no edema, no deformity, no laceration, no pain, no spasm and normal pulse.       Back:  SLR negative patient describes a "catching" sensation on the left flank when the right left only is engaged- the patient was not present when the left leg was engaged passively or actively. Entire back area was palpated with deep and light palpation and no point tenderness was found- back was WNL on exam and free of edema, ecchymosis or s/s of trauma or injury.  Lymphadenopathy:       Head (right side): No submental and no submandibular adenopathy present.       Head (left side): No submental and no submandibular adenopathy present.    He has no cervical adenopathy.  Neurological: He is alert and oriented to  person, place, and time. He has normal strength. No cranial nerve deficit or sensory deficit. He displays a negative Romberg sign. GCS eye subscore is 4. GCS verbal subscore is 5. GCS motor subscore is 6.  Neuro exam Completed WNL  Psychiatric: He has a normal mood and affect.  Vitals reviewed.   A: 1. Flank pain   2. Muscle spasm    P: Discussed exam  findings, diagnosis etiology and medication use and indications reviewed with patient. Follow- Up and discharge instructions provided. No emergent/urgent issues found on exam.  Patient verbalized understanding of information provided and agrees with plan of care (POC), all questions answered.  1. Flank pain - POCT Urinalysis Dipstick (Automated) Results for orders placed or performed in visit on 08/24/18 (from the past 24 hour(s))  POCT Urinalysis Dipstick (Automated)     Status: Abnormal   Collection Time: 08/24/18  2:00 PM  Result Value Ref Range   Color, UA yellow    Clarity, UA clear    Glucose, UA Negative Negative   Bilirubin, UA neg    Ketones, UA neg    Spec Grav, UA 1.015 1.010 - 1.025   Blood, UA neg    pH, UA 5.0 5.0 - 8.0   Protein, UA Positive (A) Negative   Urobilinogen, UA 0.2 0.2 or 1.0 E.U./dL   Nitrite, UA ne    Leukocytes, UA Negative Negative    2. Muscle spasm - cyclobenzaprine (FLEXERIL) 10 MG tablet; Take 1 tablet (10 mg total) by mouth 3 (three) times daily as needed for muscle spasms (may cause drowsiness do not take if working).  PLAN< Discussed the need for patient to discuss with employer if this was a workplace injury- work note provided with return for tomorrow. Dicussed repeat treatment plan similar to the one initiated earlier this year by his PCP, due to pain in a similar location. Advised to plan to see PCP if able by the end of the week for comprehensive evaluation and potential treatment plan progression.  Will re-prescribed muscle relaxant a 3 day +1 tablet supply, and advised 1000 mg of Tylenol prn up to TID for the next few days. Patient advised to call to make the appointment today.

## 2018-08-26 ENCOUNTER — Telehealth: Payer: Self-pay

## 2018-08-26 NOTE — Telephone Encounter (Signed)
Patient states he is doing well overall and still have some congestion.

## 2018-10-07 ENCOUNTER — Other Ambulatory Visit: Payer: Self-pay

## 2018-10-07 DIAGNOSIS — J302 Other seasonal allergic rhinitis: Secondary | ICD-10-CM

## 2018-10-07 MED ORDER — CETIRIZINE HCL 10 MG PO TABS: 10.0000 mg | ORAL_TABLET | Freq: Every day | ORAL | 4 refills | Status: DC

## 2018-10-07 MED FILL — CETIRIZINE HCL 10 MG TABLET: 10 | 100 days supply | Qty: 100 | Fill #0

## 2018-10-18 ENCOUNTER — Other Ambulatory Visit: Payer: Self-pay | Admitting: Physician Assistant

## 2018-10-18 ENCOUNTER — Other Ambulatory Visit: Payer: Self-pay

## 2018-10-18 MED ORDER — MONTELUKAST SODIUM 10 MG PO TABS: 10.0000 mg | ORAL_TABLET | Freq: Every day | ORAL | 3 refills | Status: DC

## 2018-10-18 MED FILL — MONTELUKAST SOD 10 MG TAB: 10 | 90 days supply | Qty: 90 | Fill #0

## 2019-02-16 MED FILL — MONTELUKAST SOD 10 MG TAB: 10 | 90 days supply | Qty: 90 | Fill #0

## 2019-05-16 ENCOUNTER — Encounter: Payer: Self-pay | Admitting: Family Medicine

## 2019-05-16 ENCOUNTER — Ambulatory Visit (INDEPENDENT_AMBULATORY_CARE_PROVIDER_SITE_OTHER): Payer: No Typology Code available for payment source | Admitting: Family Medicine

## 2019-05-16 VITALS — BP 123/93 | HR 71 | Temp 97.9°F | Wt 262.0 lb

## 2019-05-16 DIAGNOSIS — J302 Other seasonal allergic rhinitis: Secondary | ICD-10-CM | POA: Diagnosis not present

## 2019-05-16 DIAGNOSIS — Z6835 Body mass index (BMI) 35.0-35.9, adult: Secondary | ICD-10-CM | POA: Diagnosis not present

## 2019-05-16 DIAGNOSIS — Z0000 Encounter for general adult medical examination without abnormal findings: Secondary | ICD-10-CM

## 2019-05-16 DIAGNOSIS — Z23 Encounter for immunization: Secondary | ICD-10-CM

## 2019-05-16 DIAGNOSIS — E782 Mixed hyperlipidemia: Secondary | ICD-10-CM | POA: Diagnosis not present

## 2019-05-16 NOTE — Progress Notes (Signed)
Devin Osborne is a 38 y.o. male who presents to [facility] today for well adult visit.  Devin Osborne is doing well.  He denies any current active medical problems today.  He recognizes obesity is a central medical problem and has started trying to lose weight by exercising regularly and starting weight watchers.  He feels like things are going pretty well.  He denies any fevers chills nausea vomiting diarrhea chest pain palpitations or shortness of breath.  He is due for Tdap today.    ROS as above:  Past Medical History:  Diagnosis Date  . Eczema 03/07/2013  . Hemorrhoid 06/20/2016  . History of nasal polyp 03/07/2013  . Hyperlipidemia 10/03/2013  . Seasonal allergies 03/07/2013   History reviewed. No pertinent surgical history. Social History   Tobacco Use  . Smoking status: Never Smoker  . Smokeless tobacco: Never Used  Substance Use Topics  . Alcohol use: No    Frequency: Never   family history includes Asthma in his father; Bipolar disorder in his mother; Hyperlipidemia in his father; Hypertension in his mother.  Medications: Current Outpatient Medications  Medication Sig Dispense Refill  . cetirizine (ZYRTEC ALLERGY) 10 MG tablet Take 1 tablet (10 mg total) by mouth daily. 90 tablet 4  . montelukast (SINGULAIR) 10 MG tablet Take 1 tablet (10 mg total) by mouth at bedtime. 90 tablet 3   No current facility-administered medications for this visit.    No Known Allergies  Health Maintenance Health Maintenance  Topic Date Due  . HIV Screening  11/23/1997  . TETANUS/TDAP  11/23/2001  . INFLUENZA VACCINE  07/02/2019     Exam:  BP (!) 123/93   Pulse 71   Temp 97.9 F (36.6 C) (Oral)   Wt 262 lb (118.8 kg)   BMI 35.53 kg/m  Wt Readings from Last 5 Encounters:  05/16/19 262 lb (118.8 kg)  08/24/18 294 lb 12.8 oz (133.7 kg)  03/23/18 280 lb (127  kg)  02/02/18 286 lb 12.8 oz (130.1 kg)  12/16/17 293 lb (132.9 kg)      Gen: Well NAD HEENT: EOMI,  MMM Lungs: Normal work of breathing. CTABL Heart: RRR no MRG Abd: NABS, Soft. Nondistended, Nontender Exts: Brisk capillary refill, warm and well perfused.  Psych: Alert and oriented normal speech thought process and affect.  Depression screen [facility] 2/9 05/16/2019 12/16/2017  Decreased Interest 0 0  Down, Depressed, Hopeless 0 0  PHQ - 2 Score 0 0  Altered sleeping 0 -  Tired, decreased energy 0 -  Change in appetite 1 -  Feeling bad or failure about yourself  0 -  Trouble concentrating 0 -  Moving slowly or fidgety/restless 0 -  Suicidal thoughts 0 -  PHQ-9 Score 1 -  Difficult doing work/chores Not difficult at all -       Lab and Radiology Results No results found for this or any previous visit (from the past 72 hour(s)). No results found.    Assessment and Plan: 38 y.o. male with well adult.  Doing well.  Check basic labs to follow-up hyperlipidemia and obesity.  Work on diet and exercise changes for obesity and mildly elevated diastolic blood pressure.  Recheck yearly if all is well.  Return sooner if needed.  Tdap given today prior to discharge.  PDMP not reviewed this encounter. Orders Placed This Encounter  Procedures  . Tdap vaccine greater than or equal to 7yo IM  .  CBC  . COMPLETE METABOLIC PANEL WITH GFR  . Lipid Panel w/reflex Direct LDL   No orders of the defined types were placed in this encounter.    Discussed warning signs or symptoms. Please see discharge instructions. Patient expresses understanding.

## 2019-05-16 NOTE — Patient Instructions (Signed)
Thank you for coming in today. I think weight watchers is good. OK to continue that. Calorie goal is 2000 per day.  Continue exercise.  Recheck yearly if all is well.

## 2019-05-17 LAB — COMPLETE METABOLIC PANEL WITHOUT GFR
AG Ratio: 1.7 (calc) (ref 1.0–2.5)
ALT: 26 U/L (ref 9–46)
AST: 24 U/L (ref 10–40)
Albumin: 4.4 g/dL (ref 3.6–5.1)
Alkaline phosphatase (APISO): 68 U/L (ref 36–130)
BUN: 14 mg/dL (ref 7–25)
CO2: 28 mmol/L (ref 20–32)
Calcium: 9.6 mg/dL (ref 8.6–10.3)
Chloride: 101 mmol/L (ref 98–110)
Creat: 1.07 mg/dL (ref 0.60–1.35)
GFR, Est African American: 103 mL/min/1.73m2
GFR, Est Non African American: 89 mL/min/1.73m2
Globulin: 2.6 g/dL (ref 1.9–3.7)
Glucose, Bld: 103 mg/dL — ABNORMAL HIGH (ref 65–99)
Potassium: 4.1 mmol/L (ref 3.5–5.3)
Sodium: 139 mmol/L (ref 135–146)
Total Bilirubin: 1.3 mg/dL — ABNORMAL HIGH (ref 0.2–1.2)
Total Protein: 7 g/dL (ref 6.1–8.1)

## 2019-05-17 LAB — LIPID PANEL W/REFLEX DIRECT LDL
Cholesterol: 190 mg/dL (ref <200)
HDL: 32 mg/dL — ABNORMAL LOW (ref >=40)
LDL Cholesterol (Calc): 124 mg/dL (calc) — ABNORMAL HIGH
Non-HDL Cholesterol (Calc): 158 mg/dL (calc) — ABNORMAL HIGH (ref <130)
Total CHOL/HDL Ratio: 5.9 (calc) — ABNORMAL HIGH (ref <5.0)
Triglycerides: 224 mg/dL — ABNORMAL HIGH (ref <150)

## 2019-05-17 LAB — CBC
HCT: 42.8 % (ref 38.5–50.0)
Hemoglobin: 14.8 g/dL (ref 13.2–17.1)
MCH: 29.9 pg (ref 27.0–33.0)
MCHC: 34.6 g/dL (ref 32.0–36.0)
MCV: 86.5 fL (ref 80.0–100.0)
MPV: 11.6 fL (ref 7.5–12.5)
Platelets: 214 Thousand/uL (ref 140–400)
RBC: 4.95 Million/uL (ref 4.20–5.80)
RDW: 12.8 % (ref 11.0–15.0)
WBC: 6.7 Thousand/uL (ref 3.8–10.8)

## 2019-07-01 MED FILL — MONTELUKAST SOD 10 MG TAB: 10 | 90 days supply | Qty: 90 | Fill #1

## 2019-09-27 ENCOUNTER — Ambulatory Visit (INDEPENDENT_AMBULATORY_CARE_PROVIDER_SITE_OTHER): Payer: No Typology Code available for payment source | Admitting: Physician Assistant

## 2019-09-27 ENCOUNTER — Other Ambulatory Visit: Payer: Self-pay

## 2019-09-27 VITALS — BP 158/98 | HR 86 | Ht 73.0 in | Wt 302.0 lb

## 2019-09-27 DIAGNOSIS — R809 Proteinuria, unspecified: Secondary | ICD-10-CM | POA: Diagnosis not present

## 2019-09-27 DIAGNOSIS — R310 Gross hematuria: Secondary | ICD-10-CM

## 2019-09-27 DIAGNOSIS — I10 Essential (primary) hypertension: Secondary | ICD-10-CM | POA: Diagnosis not present

## 2019-09-27 LAB — POCT URINALYSIS DIPSTICK
Bilirubin, UA: NEGATIVE
Glucose, UA: NEGATIVE
Leukocytes, UA: NEGATIVE
Nitrite, UA: NEGATIVE
Protein, UA: POSITIVE — AB
Spec Grav, UA: 1.02 (ref 1.010–1.025)
Urobilinogen, UA: 1 E.U./dL
pH, UA: 7.5 (ref 5.0–8.0)

## 2019-09-27 MED ORDER — AMLODIPINE BESYLATE 2.5 MG PO TABS: 2.5000 mg | ORAL_TABLET | Freq: Every day | ORAL | 1 refills | Status: DC

## 2019-09-27 MED FILL — AMLODIPINE 2.5 MG TABLET: 2.5 | 60 days supply | Qty: 60 | Fill #0

## 2019-09-27 NOTE — Patient Instructions (Signed)
Hypertension, Adult High blood pressure (hypertension) is when the force of blood pumping through the arteries is too strong. The arteries are the blood vessels that carry blood from the heart throughout the body. Hypertension forces the heart to work harder to pump blood and may cause arteries to become narrow or stiff. Untreated or uncontrolled hypertension can cause a heart attack, heart failure, a stroke, kidney disease, and other problems. A blood pressure reading consists of a higher number over a lower number. Ideally, your blood pressure should be below 120/80. The first ("top") number is called the systolic pressure. It is a measure of the pressure in your arteries as your heart beats. The second ("bottom") number is called the diastolic pressure. It is a measure of the pressure in your arteries as the heart relaxes. What are the causes? The exact cause of this condition is not known. There are some conditions that result in or are related to high blood pressure. What increases the risk? Some risk factors for high blood pressure are under your control. The following factors may make you more likely to develop this condition:  Smoking.  Having type 2 diabetes mellitus, high cholesterol, or both.  Not getting enough exercise or physical activity.  Being overweight.  Having too much fat, sugar, calories, or salt (sodium) in your diet.  Drinking too much alcohol. Some risk factors for high blood pressure may be difficult or impossible to change. Some of these factors include:  Having chronic kidney disease.  Having a family history of high blood pressure.  Age. Risk increases with age.  Race. You may be at higher risk if you are African American.  Gender. Men are at higher risk than women before age 45. After age 65, women are at higher risk than men.  Having obstructive sleep apnea.  Stress. What are the signs or symptoms? High blood pressure may not cause symptoms. Very high  blood pressure (hypertensive crisis) may cause:  Headache.  Anxiety.  Shortness of breath.  Nosebleed.  Nausea and vomiting.  Vision changes.  Severe chest pain.  Seizures. How is this diagnosed? This condition is diagnosed by measuring your blood pressure while you are seated, with your arm resting on a flat surface, your legs uncrossed, and your feet flat on the floor. The cuff of the blood pressure monitor will be placed directly against the skin of your upper arm at the level of your heart. It should be measured at least twice using the same arm. Certain conditions can cause a difference in blood pressure between your right and left arms. Certain factors can cause blood pressure readings to be lower or higher than normal for a short period of time:  When your blood pressure is higher when you are in a health care provider's office than when you are at home, this is called white coat hypertension. Most people with this condition do not need medicines.  When your blood pressure is higher at home than when you are in a health care provider's office, this is called masked hypertension. Most people with this condition may need medicines to control blood pressure. If you have a high blood pressure reading during one visit or you have normal blood pressure with other risk factors, you may be asked to:  Return on a different day to have your blood pressure checked again.  Monitor your blood pressure at home for 1 week or longer. If you are diagnosed with hypertension, you may have other blood or   imaging tests to help your health care provider understand your overall risk for other conditions. How is this treated? This condition is treated by making healthy lifestyle changes, such as eating healthy foods, exercising more, and reducing your alcohol intake. Your health care provider may prescribe medicine if lifestyle changes are not enough to get your blood pressure under control, and if:   Your systolic blood pressure is above 130.  Your diastolic blood pressure is above 80. Your personal target blood pressure may vary depending on your medical conditions, your age, and other factors. Follow these instructions at home: Eating and drinking   Eat a diet that is high in fiber and potassium, and low in sodium, added sugar, and fat. An example eating plan is called the DASH (Dietary Approaches to Stop Hypertension) diet. To eat this way: ? Eat plenty of fresh fruits and vegetables. Try to fill one half of your plate at each meal with fruits and vegetables. ? Eat whole grains, such as whole-wheat pasta, brown rice, or whole-grain bread. Fill about one fourth of your plate with whole grains. ? Eat or drink low-fat dairy products, such as skim milk or low-fat yogurt. ? Avoid fatty cuts of meat, processed or cured meats, and poultry with skin. Fill about one fourth of your plate with lean proteins, such as fish, chicken without skin, beans, eggs, or tofu. ? Avoid pre-made and processed foods. These tend to be higher in sodium, added sugar, and fat.  Reduce your daily sodium intake. Most people with hypertension should eat less than 1,500 mg of sodium a day.  Do not drink alcohol if: ? Your health care provider tells you not to drink. ? You are pregnant, may be pregnant, or are planning to become pregnant.  If you drink alcohol: ? Limit how much you use to:  0-1 drink a day for women.  0-2 drinks a day for men. ? Be aware of how much alcohol is in your drink. In the [place] one drink equals one 12 oz bottle of beer (355 mL), one 5 oz glass of wine (148 mL), or one 1 oz glass of hard liquor (44 mL). Lifestyle   Work with your health care provider to maintain a healthy body weight or to lose weight. Ask what an ideal weight is for you.  Get at least 30 minutes of exercise most days of the week. Activities may include walking, swimming, or biking.  Include exercise to strengthen  your muscles (resistance exercise), such as Pilates or lifting weights, as part of your weekly exercise routine. Try to do these types of exercises for 30 minutes at least 3 days a week.  Do not use any products that contain nicotine or tobacco, such as cigarettes, e-cigarettes, and chewing tobacco. If you need help quitting, ask your health care provider.  Monitor your blood pressure at home as told by your health care provider.  Keep all follow-up visits as told by your health care provider. This is important. Medicines  Take over-the-counter and prescription medicines only as told by your health care provider. Follow directions carefully. Blood pressure medicines must be taken as prescribed.  Do not skip doses of blood pressure medicine. Doing this puts you at risk for problems and can make the medicine less effective.  Ask your health care provider about side effects or reactions to medicines that you should watch for. Contact a health care provider if you:  Think you are having a reaction to a medicine you   are taking.  Have headaches that keep coming back (recurring).  Feel dizzy.  Have swelling in your ankles.  Have trouble with your vision. Get help right away if you:  Develop a severe headache or confusion.  Have unusual weakness or numbness.  Feel faint.  Have severe pain in your chest or abdomen.  Vomit repeatedly.  Have trouble breathing. Summary  Hypertension is when the force of blood pumping through your arteries is too strong. If this condition is not controlled, it may put you at risk for serious complications.  Your personal target blood pressure may vary depending on your medical conditions, your age, and other factors. For most people, a normal blood pressure is less than 120/80.  Hypertension is treated with lifestyle changes, medicines, or a combination of both. Lifestyle changes include losing weight, eating a healthy, low-sodium diet, exercising  more, and limiting alcohol. This information is not intended to replace advice given to you by your health care provider. Make sure you discuss any questions you have with your health care provider. Document Released: 11/17/2005 Document Revised: 07/28/2018 Document Reviewed: 07/28/2018 Elsevier Patient Education  2020 [employer] Hematuria, Adult Hematuria is blood in the urine. Blood may be visible in the urine, or it may be identified with a test. This condition can be caused by infections of the bladder, urethra, kidney, or prostate. Other possible causes include:  Kidney stones.  Cancer of the urinary tract.  Too much calcium in the urine.  Conditions that are passed from parent to child (inherited conditions).  Exercise that requires a lot of energy. Infections can usually be treated with medicine, and a kidney stone usually will pass through your urine. If neither of these is the cause of your hematuria, more tests may be needed to identify the cause of your symptoms. It is very important to tell your health care provider about any blood in your urine, even if it is painless or the blood stops without treatment. Blood in the urine, when it happens and then stops and then happens again, can be a symptom of a very serious condition, including cancer. There is no pain in the initial stages of many urinary cancers. Follow these instructions at home: Medicines  Take over-the-counter and prescription medicines only as told by your health care provider.  If you were prescribed an antibiotic medicine, take it as told by your health care provider. Do not stop taking the antibiotic even if you start to feel better. Eating and drinking  Drink enough fluid to keep your urine clear or pale yellow. It is recommended that you drink 3-4 quarts (2.8-3.8 L) a day. If you have been diagnosed with an infection, it is recommended that you drink cranberry juice in addition to large amounts of water.   Avoid caffeine, tea, and carbonated beverages. These tend to irritate the bladder.  Avoid alcohol because it may irritate the prostate (men). General instructions  If you have been diagnosed with a kidney stone, follow your health care provider's instructions about straining your urine to catch the stone.  Empty your bladder often. Avoid holding urine for long periods of time.  If you are male: ? After a bowel movement, wipe from front to back and use each piece of toilet paper only once. ? Empty your bladder before and after sex.  Pay attention to any changes in your symptoms. Tell your health care provider about any changes or any new symptoms.  It is your responsibility to get your  test results. Ask your health care provider, or the department performing the test, when your results will be ready.  Keep all follow-up visits as told by your health care provider. This is important. Contact a health care provider if:  You develop back pain.  You have a fever.  You have nausea or vomiting.  Your symptoms do not improve after 3 days.  Your symptoms get worse. Get help right away if:  You develop severe vomiting and are unable take medicine without vomiting.  You develop severe pain in your back or abdomen even though you are taking medicine.  You pass a large amount of blood in your urine.  You pass blood clots in your urine.  You feel very weak or like you might faint.  You faint. Summary  Hematuria is blood in the urine. It has many possible causes.  It is very important that you tell your health care provider about any blood in your urine, even if it is painless or the blood stops without treatment.  Take over-the-counter and prescription medicines only as told by your health care provider.  Drink enough fluid to keep your urine clear or pale yellow. This information is not intended to replace advice given to you by your health care provider. Make sure you discuss  any questions you have with your health care provider. Document Released: 11/17/2005 Document Revised: 10/30/2017 Document Reviewed: 12/20/2016 Elsevier Patient Education  2020 [employer].

## 2019-09-27 NOTE — Progress Notes (Signed)
   Subjective:    Patient ID: Devin Osborne, male    DOB: 1981/01/12, 38 y.o.   MRN: [id number]  HPI  Pt is a 38 yo obese male with HLD who presents to the clinic with 7 days of blood in urine and dark urine. He noticed it first Wednesday or Thursday of last week a very small amount. 2-3 days later he noticed much more blood and urine was dark orange every time he went to urinate. He denies any dysuria, flank pain, abdominal pain. He has been taking extra vitamin C to stay healthier through covid pandemic. He denies any diet changes or medication changes. No fever, chills, body aches. No increase in fatigue, night sweats, weight loss, frequent changes, weak stream. He is noticing the blood less and less.   Pt denies any smoking or alcohol use.   Pt has no family hx of bladder/renal/prostate disease/cancer. .. Family History  Problem Relation Age of Onset  . Hypertension Mother   . Bipolar disorder Mother   . Hyperlipidemia Father   . Asthma Father        Review of Systems  All other systems reviewed and are negative.      Objective:   Physical Exam Vitals signs reviewed.  Constitutional:      Appearance: Normal appearance. He is obese.  HENT:     Head: Normocephalic.  Cardiovascular:     Rate and Rhythm: Normal rate and regular rhythm.     Pulses: Normal pulses.  Pulmonary:     Effort: Pulmonary effort is normal.     Breath sounds: Normal breath sounds.  Abdominal:     Tenderness: There is no abdominal tenderness. There is no right CVA tenderness or left CVA tenderness.  Musculoskeletal:        General: No swelling.     Right lower leg: No edema.     Left lower leg: No edema.  Lymphadenopathy:     Cervical: No cervical adenopathy.  Skin:    Findings: No rash.  Neurological:     General: No focal deficit present.     Mental Status: He is alert and oriented to person, place, and time.  Psychiatric:        Mood and Affect: Mood normal.            Assessment & Plan:  Marland KitchenMarland KitchenBabyboy was seen today for hematuria.  Diagnoses and all orders for this visit:  Hematuria, gross -     POCT urinalysis dipstick -     Urine Culture -     CBC with Differential/Platelet -     PSA -     COMPLETE METABOLIC PANEL WITH GFR -     Urinalysis, microscopic only -     Antistreptolysin O titer  Essential hypertension -     COMPLETE METABOLIC PANEL WITH GFR -     amLODipine (NORVASC) 2.5 MG tablet; Take 1 tablet (2.5 mg total) by mouth daily.   Unclear etiology of hematuria today.  UA showed blood, ketones, protein. Negative for nitrates or leukocytes. Will order culture and microscopic.  Will order labs with CBC, PSA, CMP, antistreptolysin titer.  If normal will likely follow up with imaging.   BP is elevated today and looking back has been elevated a few times. Pt's wife is a cma. Encouraged her to start checking it for him. Gave norvasc 2.5mg  to start.  Follow up in 4 weeks.

## 2019-09-28 ENCOUNTER — Encounter: Payer: Self-pay | Admitting: Physician Assistant

## 2019-09-28 ENCOUNTER — Other Ambulatory Visit: Payer: Self-pay | Admitting: Neurology

## 2019-09-28 DIAGNOSIS — I10 Essential (primary) hypertension: Secondary | ICD-10-CM | POA: Insufficient documentation

## 2019-09-28 DIAGNOSIS — R310 Gross hematuria: Secondary | ICD-10-CM

## 2019-09-28 DIAGNOSIS — R809 Proteinuria, unspecified: Secondary | ICD-10-CM | POA: Insufficient documentation

## 2019-09-28 LAB — ANTISTREPTOLYSIN O TITER: ASO: <50 IU/mL (ref <200)

## 2019-09-28 LAB — COMPLETE METABOLIC PANEL WITH GFR
AG Ratio: 1.8 (calc) (ref 1.0–2.5)
ALT: 29 U/L (ref 9–46)
AST: 25 U/L (ref 10–40)
Albumin: 4.3 g/dL (ref 3.6–5.1)
Alkaline phosphatase (APISO): 70 U/L (ref 36–130)
BUN: 13 mg/dL (ref 7–25)
CO2: 27 mmol/L (ref 20–32)
Calcium: 9.4 mg/dL (ref 8.6–10.3)
Chloride: 102 mmol/L (ref 98–110)
Creat: 1 mg/dL (ref 0.60–1.35)
GFR, Est African American: 112 mL/min/{1.73_m2} (ref >=60)
GFR, Est Non African American: 96 mL/min/{1.73_m2} (ref >=60)
Globulin: 2.4 g/dL (calc) (ref 1.9–3.7)
Glucose, Bld: 101 mg/dL — ABNORMAL HIGH (ref 65–99)
Potassium: 3.9 mmol/L (ref 3.5–5.3)
Sodium: 139 mmol/L (ref 135–146)
Total Bilirubin: 0.5 mg/dL (ref 0.2–1.2)
Total Protein: 6.7 g/dL (ref 6.1–8.1)

## 2019-09-28 LAB — URINALYSIS, MICROSCOPIC ONLY
Bacteria, UA: NONE SEEN /HPF
Hyaline Cast: NONE SEEN /LPF
RBC / HPF: >=60 /HPF — AB (ref 0–2)
Squamous Epithelial / HPF: NONE SEEN /HPF (ref <=5)

## 2019-09-28 LAB — CBC WITH DIFFERENTIAL/PLATELET
Absolute Monocytes: 845 cells/uL (ref 200–950)
Basophils Absolute: 79 cells/uL (ref 0–200)
Basophils Relative: 1 %
Eosinophils Absolute: 395 cells/uL (ref 15–500)
Eosinophils Relative: 5 %
HCT: 41.1 % (ref 38.5–50.0)
Hemoglobin: 14.2 g/dL (ref 13.2–17.1)
Lymphs Abs: 2275 cells/uL (ref 850–3900)
MCH: 29.4 pg (ref 27.0–33.0)
MCHC: 34.5 g/dL (ref 32.0–36.0)
MCV: 85.1 fL (ref 80.0–100.0)
MPV: 11.8 fL (ref 7.5–12.5)
Monocytes Relative: 10.7 %
Neutro Abs: 4306 cells/uL (ref 1500–7800)
Neutrophils Relative %: 54.5 %
Platelets: 233 10*3/uL (ref 140–400)
RBC: 4.83 10*6/uL (ref 4.20–5.80)
RDW: 12.3 % (ref 11.0–15.0)
Total Lymphocyte: 28.8 %
WBC: 7.9 10*3/uL (ref 3.8–10.8)

## 2019-09-28 LAB — PSA: PSA: 0.5 ng/mL (ref <=4.0)

## 2019-09-28 NOTE — Progress Notes (Signed)
Call pt: WBC great. No anemic. PSA normal. Kidney function is good. Liver function is good. RBC detected on culture that confirms it is blood making your urine dark. We need to do a CT scan.   Desarai Barrack,  Can we call imaging to confirm correct order. I evaluating hematuria and need to look at bladder, kidneys, ureters etc. epocrates states CT urography but do not see that in EMR. What does radiologist recommend.

## 2019-09-29 LAB — URINE CULTURE
MICRO NUMBER:: 1035085
Result:: NO GROWTH
SPECIMEN QUALITY:: ADEQUATE

## 2019-09-29 NOTE — Progress Notes (Signed)
No growth on culture

## 2019-09-30 ENCOUNTER — Other Ambulatory Visit: Payer: Self-pay | Admitting: Physician Assistant

## 2019-09-30 DIAGNOSIS — R310 Gross hematuria: Secondary | ICD-10-CM

## 2019-09-30 NOTE — Progress Notes (Signed)
Pt called back and requested referral to be made before results of CT scan. Placed today. CT scheduled for next week.

## 2019-10-05 ENCOUNTER — Other Ambulatory Visit: Payer: Self-pay

## 2019-10-05 ENCOUNTER — Ambulatory Visit (HOSPITAL_COMMUNITY)
Admission: RE | Admit: 2019-10-05 | Discharge: 2019-10-05 | Disposition: A | Discharge status: Home / Self Care | Payer: No Typology Code available for payment source | Source: Ambulatory Visit | Attending: Physician Assistant | Admitting: Physician Assistant

## 2019-10-05 DIAGNOSIS — R310 Gross hematuria: Secondary | ICD-10-CM | POA: Insufficient documentation

## 2019-10-05 MED ORDER — IOHEXOL 300 MG/ML  SOLN
150.0000 mL | Freq: Once | INTRAMUSCULAR | Status: AC | PRN
  Administered 2019-10-05: 150 mL via INTRAVENOUS

## 2019-10-07 ENCOUNTER — Ambulatory Visit (HOSPITAL_COMMUNITY): Payer: No Typology Code available for payment source

## 2019-10-07 ENCOUNTER — Other Ambulatory Visit: Payer: Self-pay | Admitting: Neurology

## 2019-10-07 DIAGNOSIS — R310 Gross hematuria: Secondary | ICD-10-CM

## 2019-10-07 NOTE — Progress Notes (Signed)
GREAT news. CT of abdomen negative for any causes of hematuria. Are you still having symptoms? Do you have urology appt?   Small hiatal hernia seen. This is stomach portion coming through diaphragm.

## 2019-10-07 NOTE — Progress Notes (Signed)
Trauma actually can cause blood in urine. I would expect you would have had to been working out pretty hard. You could return some urine to lab and recheck dipstick for blood. If no blood you could wake on urology appt.

## 2019-10-12 LAB — URINALYSIS, ROUTINE W REFLEX MICROSCOPIC
Bilirubin Urine: NEGATIVE
Glucose, UA: NEGATIVE
Hgb urine dipstick: NEGATIVE
Ketones, ur: NEGATIVE
Leukocytes,Ua: NEGATIVE
Nitrite: NEGATIVE
Protein, ur: NEGATIVE
Specific Gravity, Urine: 1.012 (ref 1.001–1.03)
pH: 7 (ref 5.0–8.0)

## 2019-10-12 NOTE — Progress Notes (Signed)
GREAT news. Urine appeared clear. No blood, protein, ketones, leukocytes. Seems whatever went on was acute and maybe from the trauma. I don't think you need to go to urology. Certainly if occurs again let us know.

## 2019-10-17 MED FILL — MONTELUKAST SOD 10 MG TAB: 10 | 90 days supply | Qty: 90 | Fill #2

## 2020-02-16 ENCOUNTER — Other Ambulatory Visit: Payer: Self-pay | Admitting: Physician Assistant

## 2020-02-16 MED FILL — MONTELUKAST SOD 10 MG TAB: 10 | 30 days supply | Qty: 30 | Fill #0

## 2020-02-17 ENCOUNTER — Other Ambulatory Visit: Payer: Self-pay | Admitting: *Deleted

## 2020-02-17 MED ORDER — MONTELUKAST SODIUM 10 MG PO TABS: 10.0000 mg | ORAL_TABLET | Freq: Every day | ORAL | 0 refills | Status: DC

## 2020-03-13 MED FILL — MONTELUKAST SOD 10 MG TAB: 10 | 60 days supply | Qty: 60 | Fill #0

## 2020-05-15 ENCOUNTER — Other Ambulatory Visit: Payer: Self-pay | Admitting: Physician Assistant

## 2020-05-15 ENCOUNTER — Ambulatory Visit (INDEPENDENT_AMBULATORY_CARE_PROVIDER_SITE_OTHER): Payer: No Typology Code available for payment source | Admitting: Physician Assistant

## 2020-05-15 ENCOUNTER — Encounter: Payer: Self-pay | Admitting: Physician Assistant

## 2020-05-15 ENCOUNTER — Other Ambulatory Visit: Payer: Self-pay

## 2020-05-15 VITALS — BP 140/95 | HR 90 | Ht 73.0 in | Wt 289.0 lb

## 2020-05-15 DIAGNOSIS — Z13220 Encounter for screening for lipoid disorders: Secondary | ICD-10-CM | POA: Diagnosis not present

## 2020-05-15 DIAGNOSIS — Z1159 Encounter for screening for other viral diseases: Secondary | ICD-10-CM | POA: Diagnosis not present

## 2020-05-15 DIAGNOSIS — R030 Elevated blood-pressure reading, without diagnosis of hypertension: Secondary | ICD-10-CM | POA: Insufficient documentation

## 2020-05-15 DIAGNOSIS — Z0000 Encounter for general adult medical examination without abnormal findings: Secondary | ICD-10-CM | POA: Diagnosis not present

## 2020-05-15 DIAGNOSIS — Z131 Encounter for screening for diabetes mellitus: Secondary | ICD-10-CM

## 2020-05-15 DIAGNOSIS — J302 Other seasonal allergic rhinitis: Secondary | ICD-10-CM

## 2020-05-15 DIAGNOSIS — B353 Tinea pedis: Secondary | ICD-10-CM | POA: Insufficient documentation

## 2020-05-15 MED ORDER — MONTELUKAST SODIUM 10 MG PO TABS: 10.0000 mg | ORAL_TABLET | Freq: Every day | ORAL | 3 refills | Status: DC

## 2020-05-15 MED ORDER — KETOCONAZOLE 2 % EX CREA: 1.0000 "application " | TOPICAL_CREAM | Freq: Two times a day (BID) | CUTANEOUS | 1 refills | Status: DC

## 2020-05-15 MED FILL — MONTELUKAST SOD 10 MG TAB: 10 | 90 days supply | Qty: 90 | Fill #0

## 2020-05-15 MED FILL — KETOCONAZOLE 2% CREAM: 2 | 30 days supply | Qty: 60 | Fill #0

## 2020-05-15 NOTE — Patient Instructions (Addendum)
Health Maintenance, Male Adopting a healthy lifestyle and getting preventive care are important in promoting health and wellness. Ask your health care provider about:  The right schedule for you to have regular tests and exams.  Things you can do on your own to prevent diseases and keep yourself healthy. What should I know about diet, weight, and exercise? Eat a healthy diet   Eat a diet that includes plenty of vegetables, fruits, low-fat dairy products, and lean protein.  Do not eat a lot of foods that are high in solid fats, added sugars, or sodium. Maintain a healthy weight Body mass index (BMI) is a measurement that can be used to identify possible weight problems. It estimates body fat based on height and weight. Your health care provider can help determine your BMI and help you achieve or maintain a healthy weight. Get regular exercise Get regular exercise. This is one of the most important things you can do for your health. Most adults should:  Exercise for at least 150 minutes each week. The exercise should increase your heart rate and make you sweat (moderate-intensity exercise).  Do strengthening exercises at least twice a week. This is in addition to the moderate-intensity exercise.  Spend less time sitting. Even light physical activity can be beneficial. Watch cholesterol and blood lipids Have your blood tested for lipids and cholesterol at 38 years of age, then have this test every 5 years. You may need to have your cholesterol levels checked more often if:  Your lipid or cholesterol levels are high.  You are older than 38 years of age.  You are at high risk for heart disease. What should I know about cancer screening? Many types of cancers can be detected early and may often be prevented. Depending on your health history and family history, you may need to have cancer screening at various ages. This may include screening for:  Colorectal cancer.  Prostate  cancer.  Skin cancer.  Lung cancer. What should I know about heart disease, diabetes, and high blood pressure? Blood pressure and heart disease  High blood pressure causes heart disease and increases the risk of stroke. This is more likely to develop in people who have high blood pressure readings, are of African descent, or are overweight.  Talk with your health care provider about your target blood pressure readings.  Have your blood pressure checked: ? Every 3-5 years if you are 18-39 years of age. ? Every year if you are 40 years old or older.  If you are between the ages of 65 and 75 and are a current or former smoker, ask your health care provider if you should have a one-time screening for abdominal aortic aneurysm (AAA). Diabetes Have regular diabetes screenings. This checks your fasting blood sugar level. Have the screening done:  Once every three years after age 45 if you are at a normal weight and have a low risk for diabetes.  More often and at a younger age if you are overweight or have a high risk for diabetes. What should I know about preventing infection? Hepatitis B If you have a higher risk for hepatitis B, you should be screened for this virus. Talk with your health care provider to find out if you are at risk for hepatitis B infection. Hepatitis C Blood testing is recommended for:  Everyone born from 1945 through 1965.  Anyone with known risk factors for hepatitis C. Sexually transmitted infections (STIs)  You should be screened each year   for STIs, including gonorrhea and chlamydia, if: ? You are sexually active and are younger than 38 years of age. ? You are older than 37 years of age and your health care provider tells you that you are at risk for this type of infection. ? Your sexual activity has changed since you were last screened, and you are at increased risk for chlamydia or gonorrhea. Ask your health care provider if you are at risk.  Ask your  health care provider about whether you are at high risk for HIV. Your health care provider may recommend a prescription medicine to help prevent HIV infection. If you choose to take medicine to prevent HIV, you should first get tested for HIV. You should then be tested every 3 months for as long as you are taking the medicine. Follow these instructions at home: Lifestyle  Do not use any products that contain nicotine or tobacco, such as cigarettes, e-cigarettes, and chewing tobacco. If you need help quitting, ask your health care provider.  Do not use street drugs.  Do not share needles.  Ask your health care provider for help if you need support or information about quitting drugs. Alcohol use  Do not drink alcohol if your health care provider tells you not to drink.  If you drink alcohol: ? Limit how much you have to 0-2 drinks a day. ? Be aware of how much alcohol is in your drink. In the [place] one drink equals one 12 oz bottle of beer (355 mL), one 5 oz glass of wine (148 mL), or one 1 oz glass of hard liquor (44 mL). General instructions  Schedule regular health, dental, and eye exams.  Stay current with your vaccines.  Tell your health care provider if: ? You often feel depressed. ? You have ever been abused or do not feel safe at home. Summary  Adopting a healthy lifestyle and getting preventive care are important in promoting health and wellness.  Follow your health care provider's instructions about healthy diet, exercising, and getting tested or screened for diseases.  Follow your health care provider's instructions on monitoring your cholesterol and blood pressure. This information is not intended to replace advice given to you by your health care provider. Make sure you discuss any questions you have with your health care provider. Document Revised: 11/10/2018 Document Reviewed: 11/10/2018 Elsevier Patient Education  [place]  Athlete's foot (tinea pedis) is a fungal infection of the skin on your feet. It often occurs on the skin that is between or underneath the toes. It can also occur on the soles of your feet. The infection can spread from person to person (is contagious). It can also spread when a person's bare feet come in contact with the fungus on shower floors or on items such as shoes. What are the causes? This condition is caused by a fungus that grows in warm, moist places. You can get athlete's foot by sharing shoes, shower stalls, towels, and wet floors with someone who is infected. Not washing your feet or changing your socks often enough can also lead to athlete's foot. What increases the risk? This condition is more likely to develop in:  Men.  People who have a weak body defense system (immune system).  People who have diabetes.  People who use public showers, such as at a gym.  People who wear heavy-duty shoes, such as Environmental manager.  Seasons with warm, humid weather. What are  the signs or symptoms? Symptoms of this condition include:  Itchy areas between your toes or on the soles of your feet.  White, flaky, or scaly areas between your toes or on the soles of your feet.  Very itchy small blisters between your toes or on the soles of your feet.  Small cuts in your skin. These cuts can become infected.  Thick or discolored toenails. How is this diagnosed? This condition may be diagnosed with a physical exam and a review of your medical history. Your health care provider may also take a skin or toenail sample to examine under a microscope. How is this treated? This condition is treated with antifungal medicines. These may be applied as powders, ointments, or creams. In severe cases, an oral antifungal medicine may be given. Follow these instructions at home: Medicines  Apply or take over-the-counter and prescription medicines only as told by your health care  provider.  Apply your antifungal medicine as told by your health care provider. Do not stop using the antifungal even if your condition improves. Foot care  Do not scratch your feet.  Keep your feet dry: ? Wear cotton or wool socks. Change your socks every day or if they become wet. ? Wear shoes that allow air to flow, such as sandals or canvas tennis shoes.  Wash and dry your feet, including the area between your toes. Also, wash and dry your feet: ? Every day or as told by your health care provider. ? After exercising. General instructions  Do not let others use towels, shoes, nail clippers, or other personal items that touch your feet.  Protect your feet by wearing sandals in wet areas, such as locker rooms and shared showers.  Keep all follow-up visits as told by your health care provider. This is important.  If you have diabetes, keep your blood sugar under control. Contact a health care provider if:  You have a fever.  You have swelling, soreness, warmth, or redness in your foot.  Your feet are not getting better with treatment.  Your symptoms get worse.  You have new symptoms. Summary  Athlete's foot (tinea pedis) is a fungal infection of the skin on your feet. It often occurs on skin that is between or underneath the toes.  This condition is caused by a fungus that grows in warm, moist places.  Symptoms include white, flaky, or scaly areas between your toes or on the soles of your feet.  This condition is treated with antifungal medicines.  Keep your feet clean. Always dry them thoroughly. This information is not intended to replace advice given to you by your health care provider. Make sure you discuss any questions you have with your health care provider. Document Revised: 11/12/2017 Document Reviewed: 09/07/2017 Elsevier Patient Education  2020 [employer].

## 2020-05-15 NOTE — Progress Notes (Signed)
Subjective:    Patient ID: Devin Osborne, male    DOB: 05-Dec-1980, 39 y.o.   MRN: [id number]  HPI  Pt is a 39 yo male who presents to the clinic for CPE.   He is more active and eating more healthy than last year. He has lost 13lbs.   He feels good. He does have a persistent white rash that is itchy in her left toes. He wonders how to treat. lotrimin helped some.   He is checking BP at home and brings in cuff. Home readings are 120's over 80s. No cP, palpitations, headaches or vision changes.   .. Active Ambulatory Problems    Diagnosis Date Noted  . History of nasal polyp 03/07/2013  . Seasonal allergies 03/07/2013  . Eczema 03/07/2013  . Hyperlipidemia 10/03/2013  . Constipation 06/20/2016  . Hemorrhoid 06/20/2016  . Non-restorative sleep 12/17/2017  . Snoring 12/17/2017  . Class 2 obesity due to excess calories without serious comorbidity with body mass index (BMI) of 39.0 to 39.9 in adult 12/17/2017  . Essential hypertension 09/28/2019  . Proteinuria 09/28/2019  . Hematuria, gross 09/28/2019  . Elevated blood pressure reading without diagnosis of hypertension 05/15/2020  . Tinea pedis of left foot 05/15/2020   Resolved Ambulatory Problems    Diagnosis Date Noted  . PHARYNGITIS 08/20/2010  . Seasickness 06/22/2015  . Thrombosed external hemorrhoid 06/23/2016   No Additional Past Medical History   .Marland Kitchen Family History  Problem Relation Age of Onset  . Hypertension Mother   . Bipolar disorder Mother   . Hyperlipidemia Father   . Asthma Father    .Marland Kitchen Social History   Socioeconomic History  . Marital status: Married    Spouse name: Not on file  . Number of children: Not on file  . Years of education: Not on file  . Highest education level: Not on file  Occupational History  . Not on file  Tobacco Use  . Smoking status: Never Smoker  . Smokeless tobacco: Never Used  Substance and Sexual Activity  . Alcohol use: No  . Drug use: No  . Sexual activity:  Yes  Other Topics Concern  . Not on file  Social History Narrative  . Not on file   Social Determinants of Health   Financial Resource Strain:   . Difficulty of Paying Living Expenses:   Food Insecurity:   . Worried About Programme researcher, broadcasting/film/video in the Last Year:   . Barista in the Last Year:   Transportation Needs:   . Freight forwarder (Medical):   Marland Kitchen Lack of Transportation (Non-Medical):   Physical Activity:   . Days of Exercise per Week:   . Minutes of Exercise per Session:   Stress:   . Feeling of Stress :   Social Connections:   . Frequency of Communication with Friends and Family:   . Frequency of Social Gatherings with Friends and Family:   . Attends Religious Services:   . Active Member of Clubs or Organizations:   . Attends Banker Meetings:   Marland Kitchen Marital Status:   Intimate Partner Violence:   . Fear of Current or Ex-Partner:   . Emotionally Abused:   Marland Kitchen Physically Abused:   . Sexually Abused:     Review of Systems  All other systems reviewed and are negative.      Objective:   Physical Exam BP (!) 140/95   Pulse 90   Ht 6\' 1"  (1.854 m)  Wt 289 lb (131.1 kg)   SpO2 96%   BMI 38.13 kg/m   General Appearance:    Alert, cooperative, no distress, appears stated age  Head:    Normocephalic, without obvious abnormality, atraumatic  Eyes:    PERRL, conjunctiva/corneas clear, EOM's intact, fundi    benign, both eyes       Ears:    Normal TM's and external ear canals, both ears  Nose:   Nares normal, septum midline, mucosa normal, no drainage    or sinus tenderness  Throat:   Lips, mucosa, and tongue normal; teeth and gums normal  Neck:   Supple, symmetrical, trachea midline, no adenopathy;       thyroid:  No enlargement/tenderness/nodules; no carotid   bruit or JVD  Back:     Symmetric, no curvature, ROM normal, no CVA tenderness  Lungs:     Clear to auscultation bilaterally, respirations unlabored  Chest wall:    No tenderness or  deformity  Heart:    Regular rate and rhythm, S1 and S2 normal, no murmur, rub   or gallop  Abdomen:     Soft, non-tender, bowel sounds active all four quadrants,    no masses, no organomegaly        Extremities:   Extremities normal, atraumatic, no cyanosis or edema  Pulses:   2+ and symmetric all extremities  Skin:   Skin color, texture, turgor normal, no rashes or lesions. Macerated skin with white discharge intertrigo pinky and 4th metatarsal left foot.   Lymph nodes:   Cervical, supraclavicular, and axillary nodes normal  Neurologic:   CNII-XII intact. Normal strength, sensation and reflexes      throughout         Assessment & Plan:  Marland KitchenMarland KitchenSimpson was seen today for annual exam.  Diagnoses and all orders for this visit:  Routine physical examination -     Hepatitis C Antibody -     Lipid Panel w/reflex Direct LDL -     COMPLETE METABOLIC PANEL WITH GFR -     CBC  Encounter for hepatitis C screening test for low risk patient -     Hepatitis C Antibody  Screening for lipid disorders -     Lipid Panel w/reflex Direct LDL  Screening for diabetes mellitus -     COMPLETE METABOLIC PANEL WITH GFR  Elevated blood pressure reading without diagnosis of hypertension  Seasonal allergies -     montelukast (SINGULAIR) 10 MG tablet; Take 1 tablet (10 mg total) by mouth at bedtime.  Tinea pedis of left foot -     ketoconazole (NIZORAL) 2 % cream; Apply 1 application topically 2 (two) times daily. To affected areas for 2-6 weeks.  .. Depression screen [facility] 2/9 05/15/2020 09/27/2019 05/16/2019 12/16/2017  Decreased Interest 0 0 0 0  Down, Depressed, Hopeless 0 0 0 0  PHQ - 2 Score 0 0 0 0  Altered sleeping 0 0 0 -  Tired, decreased energy 0 0 0 -  Change in appetite 0 1 1 -  Feeling bad or failure about yourself  0 0 0 -  Trouble concentrating 0 0 0 -  Moving slowly or fidgety/restless 0 0 0 -  Suicidal thoughts 0 0 0 -  PHQ-9 Score 0 1 1 -  Difficult doing work/chores Not  difficult at all Not difficult at all Not difficult at all -   .Marland KitchenStart a regular exercise program and make sure you are eating a healthy diet Try to  eat 4 servings of dairy a day or take a calcium supplement (500mg  twice a day). Your vaccines are up to date.  Fasting labs ordered.   Treated for tinea pedis. HO given for prevention.   BP at home readings are wonderful. Checked cuff and same readings both cuffs. Continue to monitor at home.

## 2020-05-16 ENCOUNTER — Encounter: Payer: Self-pay | Admitting: Physician Assistant

## 2020-05-16 ENCOUNTER — Other Ambulatory Visit: Payer: Self-pay | Admitting: Physician Assistant

## 2020-05-16 LAB — COMPLETE METABOLIC PANEL WITH GFR
AG Ratio: 1.7 (calc) (ref 1.0–2.5)
ALT: 25 U/L (ref 9–46)
AST: 21 U/L (ref 10–40)
Albumin: 4.5 g/dL (ref 3.6–5.1)
Alkaline phosphatase (APISO): 75 U/L (ref 36–130)
BUN: 13 mg/dL (ref 7–25)
CO2: 29 mmol/L (ref 20–32)
Calcium: 9.5 mg/dL (ref 8.6–10.3)
Chloride: 101 mmol/L (ref 98–110)
Creat: 0.89 mg/dL (ref 0.60–1.35)
GFR, Est African American: 127 mL/min/{1.73_m2} (ref >=60)
GFR, Est Non African American: 109 mL/min/{1.73_m2} (ref >=60)
Globulin: 2.6 g/dL (calc) (ref 1.9–3.7)
Glucose, Bld: 84 mg/dL (ref 65–99)
Potassium: 3.7 mmol/L (ref 3.5–5.3)
Sodium: 138 mmol/L (ref 135–146)
Total Bilirubin: 1 mg/dL (ref 0.2–1.2)
Total Protein: 7.1 g/dL (ref 6.1–8.1)

## 2020-05-16 LAB — CBC
HCT: 43.8 % (ref 38.5–50.0)
Hemoglobin: 15.3 g/dL (ref 13.2–17.1)
MCH: 29.6 pg (ref 27.0–33.0)
MCHC: 34.9 g/dL (ref 32.0–36.0)
MCV: 84.7 fL (ref 80.0–100.0)
MPV: 11.1 fL (ref 7.5–12.5)
Platelets: 241 10*3/uL (ref 140–400)
RBC: 5.17 10*6/uL (ref 4.20–5.80)
RDW: 12.4 % (ref 11.0–15.0)
WBC: 9.3 10*3/uL (ref 3.8–10.8)

## 2020-05-16 LAB — LIPID PANEL W/REFLEX DIRECT LDL
Cholesterol: 216 mg/dL — ABNORMAL HIGH (ref <200)
HDL: 36 mg/dL — ABNORMAL LOW (ref >=40)
LDL Cholesterol (Calc): 149 mg/dL (calc) — ABNORMAL HIGH
Non-HDL Cholesterol (Calc): 180 mg/dL (calc) — ABNORMAL HIGH (ref <130)
Total CHOL/HDL Ratio: 6 (calc) — ABNORMAL HIGH (ref <5.0)
Triglycerides: 171 mg/dL — ABNORMAL HIGH (ref <150)

## 2020-05-16 LAB — HEPATITIS C ANTIBODY
Hepatitis C Ab: NONREACTIVE
SIGNAL TO CUT-OFF: 0.01 (ref <1.00)

## 2020-05-16 MED ORDER — ATORVASTATIN CALCIUM 20 MG PO TABS
20.0000 mg | ORAL_TABLET | Freq: Every day | ORAL | 3 refills | Status: DC
Start: 2020-05-16 — End: 2020-12-13

## 2020-05-16 MED FILL — ATORVASTATIN 20 MG TABLET: 20 | 90 days supply | Qty: 90 | Fill #0

## 2020-05-16 NOTE — Progress Notes (Signed)
Weber,   Good news is kidney, sugars, liver, hemoglobin all look great.  Bad news is cholesterol worsened. Your bad cholesterol went up and thankfully your good cholesterol went up some too. Your triglycerides did come down.  Exercise and low fat diet can help these numbers but sometimes it is not enough. Your LDL primary prevention goal is under 100. A statin could help you to get there. Thoughts?

## 2020-05-16 NOTE — Telephone Encounter (Signed)
Medication sent.

## 2020-05-16 NOTE — Telephone Encounter (Signed)
Patient also left a vm about this. I sent some diet information via mychart. He would like medication sent to pharmacy on file.

## 2020-05-21 ENCOUNTER — Encounter: Payer: Self-pay | Admitting: Physician Assistant

## 2020-05-21 DIAGNOSIS — G8929 Other chronic pain: Secondary | ICD-10-CM

## 2020-05-21 DIAGNOSIS — M549 Dorsalgia, unspecified: Secondary | ICD-10-CM

## 2020-05-21 DIAGNOSIS — M25512 Pain in left shoulder: Secondary | ICD-10-CM

## 2020-05-22 NOTE — Telephone Encounter (Signed)
Ok for referral upper back pain and bilateral shoulder pain.

## 2020-05-22 NOTE — Telephone Encounter (Signed)
Referral entered  

## 2020-07-17 ENCOUNTER — Ambulatory Visit
Admission: EM | Admit: 2020-07-17 | Discharge: 2020-07-17 | Disposition: A | Discharge status: Home / Self Care | Payer: No Typology Code available for payment source | Attending: Family Medicine | Admitting: Family Medicine

## 2020-07-17 ENCOUNTER — Other Ambulatory Visit: Payer: Self-pay

## 2020-07-17 DIAGNOSIS — H60502 Unspecified acute noninfective otitis externa, left ear: Secondary | ICD-10-CM | POA: Diagnosis not present

## 2020-07-17 DIAGNOSIS — L299 Pruritus, unspecified: Secondary | ICD-10-CM

## 2020-07-17 DIAGNOSIS — H9202 Otalgia, left ear: Secondary | ICD-10-CM | POA: Diagnosis not present

## 2020-07-17 MED ORDER — AMOXICILLIN-POT CLAVULANATE 875-125 MG PO TABS
1.0000 | ORAL_TABLET | Freq: Two times a day (BID) | ORAL | 0 refills | Status: AC
Start: 2020-07-17 — End: 2020-07-24

## 2020-07-17 MED ORDER — CIPROFLOXACIN-DEXAMETHASONE 0.3-0.1 % OT SUSP
4.0000 [drp] | Freq: Two times a day (BID) | OTIC | 0 refills | Status: AC
Start: 2020-07-17 — End: 2020-07-24

## 2020-07-17 MED FILL — CIPROFLOXACIN-DEXAMETHASONE: 0.3-0.1 | 7 days supply | Qty: 8 | Fill #0

## 2020-07-17 MED FILL — AMOX-CLAV 875-125 MG TABLET: 875-125 | 7 days supply | Qty: 14 | Fill #0

## 2020-07-17 NOTE — ED Provider Notes (Signed)
[facility]   [id number] 07/17/20 Arrival Time: 1527  CC: EAR PAIN  SUBJECTIVE: History from: patient.  Devin Osborne is a 39 y.o. male who presents with of left ear pain and itching for the past 3 days. Reports that he felt like he had a wax buildup and had his wife irrigate his ear with a large wax ball as the result. Denies a precipitating event, such as swimming or wearing ear plugs. Patient states the pain is constant and achy in character. Patient has not taken OTC medications for this. Symptoms are made worse with lying down. Denies similar symptoms in the past. Denies fever, chills, fatigue, sinus pain, rhinorrhea, ear discharge, sore throat, SOB, wheezing, chest pain, nausea, changes in bowel or bladder habits.    ROS: As per HPI.  All other pertinent ROS negative.     Past Medical History:  Diagnosis Date  . Eczema 03/07/2013  . Hemorrhoid 06/20/2016  . History of nasal polyp 03/07/2013  . Hyperlipidemia 10/03/2013  . Seasonal allergies 03/07/2013   History reviewed. No pertinent surgical history. No Known Allergies No current facility-administered medications on file prior to encounter.   Current Outpatient Medications on File Prior to Encounter  Medication Sig Dispense Refill  . atorvastatin (LIPITOR) 20 MG tablet Take 1 tablet (20 mg total) by mouth daily. 90 tablet 3  . cetirizine (ZYRTEC ALLERGY) 10 MG tablet Take 1 tablet (10 mg total) by mouth daily. 90 tablet 4  . ketoconazole (NIZORAL) 2 % cream Apply 1 application topically 2 (two) times daily. To affected areas for 2-6 weeks. 60 g 1  . montelukast (SINGULAIR) 10 MG tablet Take 1 tablet (10 mg total) by mouth at bedtime. 90 tablet 3   Social History   Socioeconomic History  . Marital status: Married    Spouse name: Not on file  . Number of children: Not on file  . Years of education: Not on file  . Highest education level: Not on file  Occupational History  . Not on file  Tobacco Use  . Smoking  status: Never Smoker  . Smokeless tobacco: Never Used  Substance and Sexual Activity  . Alcohol use: Yes  . Drug use: No  . Sexual activity: Yes    Birth control/protection: None    Comment: Married  Other Topics Concern  . Not on file  Social History Narrative  . Not on file   Social Determinants of Health   Financial Resource Strain:   . Difficulty of Paying Living Expenses:   Food Insecurity:   . Worried About Programme researcher, broadcasting/film/video in the Last Year:   . Barista in the Last Year:   Transportation Needs:   . Freight forwarder (Medical):   Marland Kitchen Lack of Transportation (Non-Medical):   Physical Activity:   . Days of Exercise per Week:   . Minutes of Exercise per Session:   Stress:   . Feeling of Stress :   Social Connections:   . Frequency of Communication with Friends and Family:   . Frequency of Social Gatherings with Friends and Family:   . Attends Religious Services:   . Active Member of Clubs or Organizations:   . Attends Banker Meetings:   Marland Kitchen Marital Status:   Intimate Partner Violence:   . Fear of Current or Ex-Partner:   . Emotionally Abused:   Marland Kitchen Physically Abused:   . Sexually Abused:    Family History  Problem Relation Age of  Onset  . Hypertension Mother   . Bipolar disorder Mother   . Hyperlipidemia Father   . Asthma Father     OBJECTIVE:  Vitals:   07/17/20 1532 07/17/20 1539  BP: (!) 153/102   Pulse: 93   Resp: 18   Temp: 98.7 F (37.1 C)   TempSrc: Oral   SpO2: 94%   Weight:  290 lb (131.5 kg)     General appearance: alert; appears fatigued HEENT: Ears: L EAC with erythema, swelling, drainage, tenderness, R EAC clear, TMs pearly gray with visible cone of light, without erythema ; Eyes: PERRL, EOMI grossly; Sinuses nontender to palpation; Nose: clear rhinorrhea; Throat: oropharynx mildly erythematous, tonsils 1+ without white tonsillar exudates, uvula midline Neck: supple without LAD Lungs: unlabored respirations,  symmetrical air entry; cough: absent; no respiratory distress Heart: regular rate and rhythm.  Radial pulses 2+ symmetrical bilaterally Skin: warm and dry Psychological: alert and cooperative; normal mood and affect  Imaging: No results found.   ASSESSMENT & PLAN:  1. Acute otitis externa of left ear, unspecified type   2. Acute otalgia, left   3. Ear itching     Meds ordered this encounter  Medications  . amoxicillin-clavulanate (AUGMENTIN) 875-125 MG tablet    Sig: Take 1 tablet by mouth 2 (two) times daily for 7 days.    Dispense:  14 tablet    Refill:  0    Order Specific Question:   Supervising Provider    Answer:   Merrilee Jansky [id number]  . ciprofloxacin-dexamethasone (CIPRODEX) OTIC suspension    Sig: Place 4 drops into the left ear 2 (two) times daily for 7 days.    Dispense:  7.5 mL    Refill:  0    Order Specific Question:   Supervising Provider    Answer:   Merrilee Jansky [[id number]]    Rest and drink plenty of fluids Prescribed augmentin 875 for 7 days Prescribed ciprodex ear drops Take medications as directed and to completion Continue to use OTC ibuprofen and/ or tylenol as needed for pain control Follow up with PCP if symptoms persists Return here or go to the ER if you have any new or worsening symptoms   Reviewed expectations re: course of current medical issues. Questions answered. Outlined signs and symptoms indicating need for more acute intervention. Patient verbalized understanding. After Visit Summary given.         Moshe Cipro, NP 07/17/20 [phone]

## 2020-07-17 NOTE — Discharge Instructions (Addendum)
You have an external ear infection and likely an inner ear infection  I have sent in ciprodex drops for you to use 4 drops in the left ear twice daily for 7 days  I have also sent in Augmentin for you to take orally twice daily for 7 days  Follow up with the ER for high fever, inconsolable crying, trouble swallowing, trouble breathing, decreased urine output, other concerning symptoms

## 2020-07-17 NOTE — ED Triage Notes (Signed)
Pt is here with left right ear pain for 1 day now, pt has taken Advil to relieve discomfort and states it helped a lot.

## 2020-07-26 ENCOUNTER — Ambulatory Visit: Payer: No Typology Code available for payment source | Admitting: Nurse Practitioner

## 2020-09-13 MED FILL — ATORVASTATIN CALCIUM 20 MG: 20 | 90 days supply | Qty: 90 | Fill #1

## 2020-10-01 MED FILL — MONTELUKAST SOD 10 MG TAB: 10 | 90 days supply | Qty: 90 | Fill #1

## 2020-11-07 ENCOUNTER — Telehealth: Payer: No Typology Code available for payment source | Admitting: Family

## 2020-11-07 ENCOUNTER — Other Ambulatory Visit: Payer: Self-pay | Admitting: Family

## 2020-11-07 DIAGNOSIS — J069 Acute upper respiratory infection, unspecified: Secondary | ICD-10-CM

## 2020-11-07 MED ORDER — FLUTICASONE PROPIONATE 50 MCG/ACT NA SUSP: 2.0000 | Freq: Every day | NASAL | 6 refills | Status: DC

## 2020-11-07 MED ORDER — BENZONATATE 100 MG PO CAPS: 100.0000 mg | ORAL_CAPSULE | Freq: Three times a day (TID) | ORAL | 0 refills | Status: DC | PRN

## 2020-11-07 MED FILL — FLUTICASONE PROP 50 MCG SPR: 50 | 30 days supply | Qty: 16 | Fill #0

## 2020-11-07 MED FILL — BENZONATATE 100 MG CAPS: 100 | 7 days supply | Qty: 20 | Fill #0

## 2020-11-07 NOTE — Progress Notes (Signed)

## 2020-11-09 ENCOUNTER — Telehealth (INDEPENDENT_AMBULATORY_CARE_PROVIDER_SITE_OTHER): Payer: No Typology Code available for payment source | Admitting: Physician Assistant

## 2020-11-09 ENCOUNTER — Encounter: Payer: Self-pay | Admitting: Physician Assistant

## 2020-11-09 ENCOUNTER — Other Ambulatory Visit: Payer: Self-pay | Admitting: Physician Assistant

## 2020-11-09 VITALS — Ht 73.0 in | Wt 290.0 lb

## 2020-11-09 DIAGNOSIS — J0100 Acute maxillary sinusitis, unspecified: Secondary | ICD-10-CM | POA: Diagnosis not present

## 2020-11-09 DIAGNOSIS — Z6838 Body mass index (BMI) 38.0-38.9, adult: Secondary | ICD-10-CM | POA: Diagnosis not present

## 2020-11-09 DIAGNOSIS — E6609 Other obesity due to excess calories: Secondary | ICD-10-CM | POA: Diagnosis not present

## 2020-11-09 MED ORDER — AZITHROMYCIN 250 MG PO TABS: ORAL_TABLET | ORAL | 0 refills | Status: DC

## 2020-11-09 MED FILL — AZITHROMYCIN 250 MG TABLET: 250 | 5 days supply | Qty: 6 | Fill #0

## 2020-11-09 NOTE — Progress Notes (Signed)
Patient ID: Devin Osborne, male   DOB: 1981-07-09, 39 y.o.   MRN: [id number] .Marland KitchenVirtual Visit via Telephone Note  I connected with Devin Osborne on 11/09/20 at  8:30 AM EST by telephone and verified that I am speaking with the correct person using two identifiers.  Location: Patient: car Provider:   Marland KitchenMarland KitchenParticipating in visit:  Patient: Devin Osborne Provider: Tandy Gaw PA-C Provider in training: Elizabeth Sauer PA-Student    I discussed the limitations, risks, security and privacy concerns of performing an evaluation and management service by telephone and the availability of in person appointments. I also discussed with the patient that there may be a patient responsible charge related to this service. The patient expressed understanding and agreed to proceed.   History of Present Illness: Patient is a 39 year old obese male who calls into the clinic with 2 weeks of URI symptoms.  Patient felt like he had a cold a few weeks ago seem to get a little better then started getting worse again.  This week he has had a lot of sinus drainage that is green and yellow.  He has been having some body aches, sore throat, sinus pressure, headache and nasal congestion.  He did have a negative Covid test earlier this week at work.  He denies any shortness of breath, loss of smell or taste, GI symptoms.  He has been using a lot of over-the-counter Mucinex, DayQuil, NyQuil with minimal relief.  His wife also has cold-like symptoms.  Patient is wanting to work on his health and nutrition.  He requests a referral to nutritionist.  We will place this today.  .. Active Ambulatory Problems    Diagnosis Date Noted  . History of nasal polyp 03/07/2013  . Seasonal allergies 03/07/2013  . Eczema 03/07/2013  . Hyperlipidemia 10/03/2013  . Constipation 06/20/2016  . Hemorrhoid 06/20/2016  . Non-restorative sleep 12/17/2017  . Snoring 12/17/2017  . Class 2 obesity due to excess calories without  serious comorbidity with body mass index (BMI) of 38.0 to 38.9 in adult 12/17/2017  . Essential hypertension 09/28/2019  . Proteinuria 09/28/2019  . Hematuria, gross 09/28/2019  . Elevated blood pressure reading without diagnosis of hypertension 05/15/2020  . Tinea pedis of left foot 05/15/2020   Resolved Ambulatory Problems    Diagnosis Date Noted  . PHARYNGITIS 08/20/2010  . Seasickness 06/22/2015  . Thrombosed external hemorrhoid 06/23/2016   No Additional Past Medical History   Reviewed med, allergy, problem list.     Observations/Objective: No acute distress Normal mood  No labored breathing, cough, wheezing. Nasal congestion.   .. Today's Vitals   11/09/20 0814  Weight: 290 lb (131.5 kg)  Height: 6\' 1"  (1.854 m)   Body mass index is 38.26 kg/m.     Assessment and Plan: Marland KitchenIssaac was seen today for sinus problem.  Diagnoses and all orders for this visit:  Acute non-recurrent maxillary sinusitis -     azithromycin (ZITHROMAX) 250 MG tablet; Take 2 tablets now and then 1 tablet for 4 days.  Class 2 obesity due to excess calories without serious comorbidity with body mass index (BMI) of 38.0 to 38.9 in adult -     Amb ref to Medical Nutrition Therapy-MNT   Treated for sinus infection.  Continue to use Flonase.  Rest and hydrate.  Referral made for nutritionist.   Follow Up Instructions:    I discussed the assessment and treatment plan with the patient. The patient was provided an opportunity to ask questions and  all were answered. The patient agreed with the plan and demonstrated an understanding of the instructions.   The patient was advised to call back or seek an in-person evaluation if the symptoms worsen or if the condition fails to improve as anticipated.  I provided 10 minutes of non-face-to-face time during this encounter.   Iran Planas, PA-C

## 2020-11-09 NOTE — Progress Notes (Deleted)
Started 2 weeks ago Drainage (green/yellow) Chills Body aches Sore throat Cough Headache Sinus pressure  Covid test - negative (earlier this week)  Past 24 hrs drainage worse - feeling better   OTC mucinex, nyquil, dayquil

## 2020-11-26 ENCOUNTER — Encounter: Payer: No Typology Code available for payment source | Attending: Physician Assistant | Admitting: Dietician

## 2020-11-26 ENCOUNTER — Encounter: Payer: Self-pay | Admitting: Dietician

## 2020-11-26 ENCOUNTER — Other Ambulatory Visit: Payer: Self-pay

## 2020-11-26 DIAGNOSIS — E6609 Other obesity due to excess calories: Secondary | ICD-10-CM | POA: Diagnosis not present

## 2020-11-26 DIAGNOSIS — Z6838 Body mass index (BMI) 38.0-38.9, adult: Secondary | ICD-10-CM | POA: Insufficient documentation

## 2020-11-26 NOTE — Patient Instructions (Addendum)
Look into a [employer] and riding the stationary bike for exercise.  Try spending your "me" time in your office instead of your recliner downstairs.  Limit yourself to a 12 oz "Tall" Caramel Machiatto from [employer]  Limit your potato chips to one handful when you are downstairs. Eat them one at a time to help slow it down.  Weigh yourself no more than once a week

## 2020-11-26 NOTE — Progress Notes (Signed)
Medical Nutrition Therapy  Appointment Start time:  [phone]  Appointment End time:  1720  Primary concerns today: Weight loss, hyperlipidemia  Referral diagnosis: E66.09 Class 2 Obesity Preferred learning style: No preference indicated Learning readiness: Ready   NUTRITION ASSESSMENT   Anthropometrics  Ht: 6'1" Wt: 303.1 lbs Body mass index is 39.99 kg/m.   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 Travels for work daily. Pt states that he think he had a misdiagnosis of HTN. Pt reports his BP is around 128-130/85.  Pt states that he wants to lose 50 pounds. Pt works from 7 am - 4 pm. Stays busy after he gets home, and then gets in his recliner and starts snacking.Pt plays basketball and cycles for PA. Pt states that his biggest problem is night time snacking. States that he does well during the day, but will snack a lot at night. Pt states his weakness is chips. Pt states that he eats poorly when he is home. States he only eats chicken or beef for dinner usually. Pt states he drinks a lot of coffee. Brings 4 bottles of water to work, packs some fruit.  Pt states that sometimes when he eats pineapples, his tongue gets itchy. Pt states sometimes he gets itchy after eating peanuts as well. States he will be getting an allergy test next year. Typical breakfast choices include: Oatmeal with cranberries and walnuts. Egg and cheese on wheat. Large grits with Smart choice butter. Pt reports he drinks a lot of Starbucks Caramel macchiotto Venti size. States up to 10 a week.  Estimated daily fluid intake: 64 oz Supplements: N/A Sleep: ~6.5 hrs Stress / self-care: Relaxes watching movies, or playing video games  Current average weekly physical activity: Walks 30-40 miles a week with  his job, basketball, cycling  24-Hr Dietary Recall First Meal: Coffee with flavored creamer, oatmeal with cranberries, walnuts Snack: 1 banana Second Meal: Grilled chicken sandwich on wheat, provolone, lettuce, mayo, tomato, green beans. Snack: none Third Meal: [place] salad with chicken, rice, black beans, cheese, corn. Snack: none Beverages: 4-6 bottles of water   NUTRITION DIAGNOSIS  NB-1.1 Food and nutrition-related knowledge deficit As related to hyperlipidemia.  As evidenced by elevated cholesterol, LDL, triglycerides, and ditary recall high in saturated fat and SSB.Marland Kitchen   NUTRITION INTERVENTION  Nutrition education (E-1) on the following topics:   Educate pt on the difference between LDL and HDL cholesterol. Educate pt on the factors that can increase and decrease HDL cholesterol. Including exercise to increase HDL, and tobacco use to decrease HDL. Educate pt on factors that can elevate LDL cholesterol, including high dietary intake of saturated fats. Educate pt on identifying sources of saturated fats, and how to make alternative food choices to lower saturated fat intake. Educate pt on the role of soluble fiber in binding to cholesterol in the GI tract an eliminating it from the body. Educate pt on dietary sources of soluble fiber. Educate pt on the potential dietary causes of hypertriglyceridemia.Educate on the role of elevated LDL,total cholesterol, and triglycerides on cardiovascular health. Educate pt on the role of physical activity in lowering LDL and increasing HDL cholesterol.   Handouts Provided Include   Cholesterol Lowering Nutrition Therapy Nutrition Care Manual  Learning Style & Readiness for Change Teaching method utilized: Visual & Auditory  Demonstrated degree of understanding via:  Teach Back  Barriers to learning/adherence to lifestyle change: None  Goals Established by Pt  Look into a [employer] and riding the stationary bike for  exercise.  Try spending your "me" time in your office instead of your recliner downstairs.  Limit yourself to a 12 oz "Tall" Caramel Machiatto from [employer]  Limit your potato chips to one handful when you are downstairs. Eat them one at a time to help slow it down.  Weigh yourself no more than once a week   MONITORING & EVALUATION Dietary intake, weekly physical activity, and Starbucks consumption in 2 months.  Next Steps  Patient is to follow up with dietitian.

## 2020-12-10 ENCOUNTER — Encounter: Payer: Self-pay | Admitting: Physician Assistant

## 2020-12-10 DIAGNOSIS — E782 Mixed hyperlipidemia: Secondary | ICD-10-CM

## 2020-12-12 DIAGNOSIS — E782 Mixed hyperlipidemia: Secondary | ICD-10-CM | POA: Diagnosis not present

## 2020-12-13 ENCOUNTER — Other Ambulatory Visit: Payer: Self-pay | Admitting: Physician Assistant

## 2020-12-13 LAB — CMP14+EGFR
ALT: 27 IU/L (ref 0–44)
AST: 23 IU/L (ref 0–40)
Albumin/Globulin Ratio: 2 (ref 1.2–2.2)
Albumin: 4.8 g/dL (ref 4.0–5.0)
Alkaline Phosphatase: 93 IU/L (ref 44–121)
BUN/Creatinine Ratio: 10 (ref 9–20)
BUN: 11 mg/dL (ref 6–20)
Bilirubin Total: 0.7 mg/dL (ref 0.0–1.2)
CO2: 28 mmol/L (ref 20–29)
Calcium: 9.4 mg/dL (ref 8.7–10.2)
Chloride: 102 mmol/L (ref 96–106)
Creatinine, Ser: 1.07 mg/dL (ref 0.76–1.27)
GFR calc Af Amer: 101 mL/min/{1.73_m2} (ref >59)
GFR calc non Af Amer: 88 mL/min/{1.73_m2} (ref >59)
Globulin, Total: 2.4 g/dL (ref 1.5–4.5)
Glucose: 101 mg/dL — ABNORMAL HIGH (ref 65–99)
Potassium: 4.1 mmol/L (ref 3.5–5.2)
Sodium: 141 mmol/L (ref 134–144)
Total Protein: 7.2 g/dL (ref 6.0–8.5)

## 2020-12-13 LAB — LIPID PANEL WITH LDL/HDL RATIO
Cholesterol, Total: 133 mg/dL (ref 100–199)
HDL: 30 mg/dL — ABNORMAL LOW (ref >39)
LDL Chol Calc (NIH): 82 mg/dL (ref 0–99)
LDL/HDL Ratio: 2.7 ratio (ref 0.0–3.6)
Triglycerides: 113 mg/dL (ref 0–149)
VLDL Cholesterol Cal: 21 mg/dL (ref 5–40)

## 2020-12-13 MED ORDER — ATORVASTATIN CALCIUM 20 MG PO TABS: 20.0000 mg | ORAL_TABLET | Freq: Every day | ORAL | 3 refills | Status: DC

## 2020-12-13 MED FILL — ATORVASTATIN CALCIUM 20 MG: 20 | 90 days supply | Qty: 90 | Fill #0

## 2020-12-13 NOTE — Telephone Encounter (Signed)
Your cholesterol looks great. Stay on statin. Your glucose up a little watch sugars and carbs. Next blood work we will add more testing on glucose.

## 2021-01-18 MED FILL — MONTELUKAST SOD 10 MG TAB: 10 | 90 days supply | Qty: 90 | Fill #2

## 2021-01-28 ENCOUNTER — Other Ambulatory Visit: Payer: Self-pay

## 2021-01-28 ENCOUNTER — Encounter: Payer: 59 | Attending: Physician Assistant | Admitting: Dietician

## 2021-01-28 ENCOUNTER — Encounter: Payer: Self-pay | Admitting: Dietician

## 2021-01-28 DIAGNOSIS — E6609 Other obesity due to excess calories: Secondary | ICD-10-CM | POA: Diagnosis not present

## 2021-01-28 DIAGNOSIS — Z6838 Body mass index (BMI) 38.0-38.9, adult: Secondary | ICD-10-CM | POA: Insufficient documentation

## 2021-01-28 NOTE — Patient Instructions (Signed)
Chew each bite of food entirely and swallow it before eating the next one.  Consider sugar free jello or pudding cups for a snack on the go.  Work up to 150 minutes a week of physical activity. Remember your health as a motivation and continue to succeed.  KEEP UP THE GREAT WORK!!

## 2021-01-28 NOTE — Progress Notes (Signed)
Medical Nutrition Therapy  Appointment Start time:  1630  Appointment End time:  1700  Primary concerns today: Weight loss, hyperlipidemia  Referral diagnosis: E66.09 Class 2 Obesity Preferred learning style: No preference indicated Learning readiness: Ready   NUTRITION ASSESSMENT   Anthropometrics  Ht: 6'1" Wt: 293.7 lbs Wt Change: -10 lbs Body mass index is 38.75 kg/m.   Clinical Medical Hx: HTN, Hyperlipidemia, Constipation, Proteinuria  Medications: Lipitor Labs: Cholesterol- 216 (high) LDL- 149 (high) Triglycerides- 171 (high) NEW (01/28/2021): Cholesterol - 133, LDL - 82, Triglycerides - 712 Notable Signs/Symptoms: N/A  Lifestyle & Dietary Hx  Pt reports losing 15 pounds since our last visit. Pt reports dramatically reducing their Starbucks sweetened coffee intake, and always gets a smaller cup if they go. Pt reports cutting back on snacking as well. Pt reports slowing down when snacking. Pt reports focusing on their health for making these changes, lowering cholesterol and blood pressure. Pt reports using almond milk in their coffee instead of regular creamer. Pt states they will be joining [employer] for the introductory months to start exercising. Pt reports wanting to join [employer] if possible.  Pt reports using Hello Fresh and states that the portion sizes are much better. Pt reports drinking a lot of water helps to keep them full. 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  Estimated daily fluid intake: 64 oz Supplements: N/A Sleep: ~6.5 hrs Stress / self-care: Relaxes watching movies, or playing video games  Current average weekly physical activity: Walks 30-40 miles a week with his job, basketball, cycling  24-Hr Dietary Recall First Meal: Coffee with flavored creamer, bowl of honey nut  cheerios/Oh's, 2% milk Snack: 1 banana Second Meal: [employer] grilled chicken salad. Snack: none Third Meal: Chicken, beans, and rice, water, diet sundrop Snack: none Beverages: 4-6 bottles of water   NUTRITION DIAGNOSIS  NB-1.1 Food and nutrition-related knowledge deficit As related to hyperlipidemia.  As evidenced by elevated cholesterol, LDL, triglycerides, and ditary recall high in saturated fat and SSB.Marland Kitchen   NUTRITION INTERVENTION  Nutrition education (E-1) on the following topics:  . Educate pt on the difference between LDL and HDL cholesterol. Educate pt on the factors that can increase and decrease HDL cholesterol. Including exercise to increase HDL, and tobacco use to decrease HDL. Educate pt on factors that can elevate LDL cholesterol, including high dietary intake of saturated fats. Educate pt on identifying sources of saturated fats, and how to make alternative food choices to lower saturated fat intake. Educate pt on the role of soluble fiber in binding to cholesterol in the GI tract an eliminating it from the body. Educate pt on dietary sources of soluble fiber. Educate pt on the potential dietary causes of hypertriglyceridemia.Educate on the role of elevated LDL,total cholesterol, and triglycerides on cardiovascular health. Educate pt on the role of physical activity in lowering LDL and increasing HDL cholesterol.   Handouts Provided Include   Cholesterol Lowering Nutrition Therapy Nutrition Care Manual  Learning Style & Readiness for Change Teaching method utilized: Visual & Auditory  Demonstrated degree of understanding via: Teach Back  Barriers to learning/adherence to lifestyle change: None  Goals Established by Pt  Chew each bite of food entirely and swallow it before eating the next one.  Consider sugar free jello or pudding cups for a snack on the go.  Work up to 150 minutes a week of physical activity. Remember your health as a motivation and continue to  succeed.  KEEP UP THE GREAT WORK!!  MONITORING & EVALUATION Dietary intake, weekly physical activity, and Starbucks consumption in 5 months.  Next Steps  Patient is to follow up with dietitian.

## 2021-04-12 ENCOUNTER — Other Ambulatory Visit (HOSPITAL_COMMUNITY): Payer: Self-pay

## 2021-04-12 MED FILL — Atorvastatin Calcium Tab 20 MG (Base Equivalent): ORAL | 90 days supply | Qty: 90 | Fill #0 | Status: AC

## 2021-04-12 MED FILL — Montelukast Sodium Tab 10 MG (Base Equiv): ORAL | 90 days supply | Qty: 90 | Fill #0 | Status: AC

## 2021-05-15 ENCOUNTER — Other Ambulatory Visit: Payer: Self-pay

## 2021-05-15 ENCOUNTER — Other Ambulatory Visit (HOSPITAL_COMMUNITY): Payer: Self-pay

## 2021-05-15 ENCOUNTER — Encounter: Payer: Self-pay | Admitting: Allergy

## 2021-05-15 ENCOUNTER — Ambulatory Visit: Payer: 59 | Admitting: Allergy

## 2021-05-15 VITALS — BP 132/78 | HR 94 | Temp 97.0°F | Resp 18 | Ht 73.0 in | Wt 294.4 lb

## 2021-05-15 DIAGNOSIS — H1013 Acute atopic conjunctivitis, bilateral: Secondary | ICD-10-CM | POA: Diagnosis not present

## 2021-05-15 DIAGNOSIS — J3089 Other allergic rhinitis: Secondary | ICD-10-CM | POA: Diagnosis not present

## 2021-05-15 DIAGNOSIS — L2089 Other atopic dermatitis: Secondary | ICD-10-CM | POA: Diagnosis not present

## 2021-05-15 DIAGNOSIS — T781XXA Other adverse food reactions, not elsewhere classified, initial encounter: Secondary | ICD-10-CM

## 2021-05-15 MED ORDER — OLOPATADINE HCL 0.2 % OP SOLN
1.0000 [drp] | Freq: Every day | OPHTHALMIC | 5 refills | Status: DC | PRN
  Filled 2021-05-15: qty 2.5, 25d supply, fill #0

## 2021-05-15 MED ORDER — LEVOCETIRIZINE DIHYDROCHLORIDE 5 MG PO TABS
5.0000 mg | ORAL_TABLET | Freq: Every evening | ORAL | 5 refills | Status: DC
Start: 2021-05-15 — End: 2021-08-30
  Filled 2021-05-15: qty 30, 30d supply, fill #0
  Filled 2021-06-18: qty 30, 30d supply, fill #1
  Filled 2021-07-31: qty 30, 30d supply, fill #2

## 2021-05-15 MED ORDER — AZELASTINE-FLUTICASONE 137-50 MCG/ACT NA SUSP
1.0000 | Freq: Two times a day (BID) | NASAL | 5 refills | Status: DC
  Filled 2021-05-15: qty 23, 30d supply, fill #0

## 2021-05-15 MED ORDER — TRIAMCINOLONE ACETONIDE 0.1 % EX OINT
TOPICAL_OINTMENT | CUTANEOUS | 2 refills | Status: DC
  Filled 2021-05-15: qty 30, 15d supply, fill #0

## 2021-05-15 NOTE — Patient Instructions (Addendum)
Allergic rhinitis with conjunctivitis -environmetnal allergy testing is positive to grass pollen, tree pollen, molds -allergen avoidance measures discussed/handouts provided - recommend trial of Xyzal 5mg  daily.  This will replace Zyrtec at this time.   -trial dymista 1 spray each nostril twice a day.  This is a combination nasal spray with Flonase + Astelin (nasal antihistamine).  This helps with both nasal congestion and drainage.  -continue singulair 10mg  daily at bedtime for now -for itchy/watery eyes recommend use of Pataday 1 drop each eye daily as needed -allergen immunotherapy discussed today including protocol, benefits and risk.  Informational handout provided.  If interested in this therapuetic option you can check with your insurance carrier for coverage.  Let know if you would like to proceed with this option.    Eczema -continue moisturization after bathing -for dry, itchy, irritated, patchy, flaky or scaly areas can use Triamcinolone ointment 0.1% twice a day as needed -keep fingernails trimmed -monitor for any food that flare eczema  Oral allergy syndrome -The oral allergy syndrome (OAS) or pollen-food allergy syndrome (PFAS) is a relatively common form of food allergy, particularly in adults. It typically occurs in people who have pollen allergies when the immune system "sees" proteins on the food that look like proteins on the pollen. This results in the allergy antibody (IgE) binding to the food instead of the pollen. Patients typically report itching and/or mild swelling of the mouth and throat immediately following ingestion of certain uncooked fruits (including nuts) or raw vegetables. Only a very small number of affected individuals experience systemic allergic reactions, such as anaphylaxis which occurs with true food allergies.  -this is most likely what is happening when you eat nuts  Follow-up in 4 months or sooner if needed

## 2021-05-15 NOTE — Progress Notes (Signed)
New Patient Note  RE: Devin Osborne MRN: [id number] DOB: 10/05/1981 Date of Office Visit: 05/15/2021  Referring provider: Nolene Ebbs Primary care provider: Jomarie Longs, PA-C  Chief Complaint: allergies and eczema  History of present illness: Devin Osborne is a 40 y.o. male presenting today for consultation for allergic rhinitis and eczema.   He states he has been suffering from allergies for years which have progressively gotten worse.  He has symptoms of itchy/watery/red eyes, runny nose with throat clearing, stuffy nose, itchy ears, occasional sneezing and sneezing.  Symptoms are worse in spring but symptoms year-round.  He does get sinus infections at least yearly during spring with antibiotic course that helps.  He does perform nasal saline rinses once a week or so.  He is on singulair for years and takes daily.  Zyrtec he takes daily for years as well.  He states Flonase is effective and takes as needed with nasal symptoms are "really bad".   Has tried Claritin which did not help.   He has eczema with problem areas on his back and neck.  He states the flares on neck is new as of past 6-7 months.  He states his eczema usually would flare during spring.  He states he has cortisone at home but states he is not regular with use.  He does moisturize consistently with lotions after bathing.    No history of asthma or food allergy.  He states he likes peanuts and tree nuts nuts but sometimes it causes a scratchy throat.      Review of systems: Review of Systems  Constitutional: Negative.   HENT:         See HPI  Eyes:        See HPI  Respiratory: Negative.    Cardiovascular: Negative.   Gastrointestinal: Negative.   Musculoskeletal: Negative.   Skin:        See HPI  Neurological: Negative.    All other systems negative unless noted above in HPI  Past medical history: Past Medical History:  Diagnosis Date   Eczema 03/07/2013   Hemorrhoid  06/20/2016   History of nasal polyp 03/07/2013   Hyperlipidemia 10/03/2013   Seasonal allergies 03/07/2013    Past surgical history: History reviewed. No pertinent surgical history.  Family history:  Family History  Problem Relation Age of Onset   Hypertension Mother    Bipolar disorder Mother    Hyperlipidemia Father    Asthma Father     Social history: Lives in a home without carpeting with gas heating and central cooling.  No pets in the home.  There is no concern for water damage, mildew or roaches in the home.  He is a courier.  He denies a smoking history.   Medication List: Current Outpatient Medications  Medication Sig Dispense Refill   atorvastatin (LIPITOR) 20 MG tablet TAKE 1 TABLET BY MOUTH ONCE A DAY 90 tablet 3   cetirizine (ZYRTEC ALLERGY) 10 MG tablet Take 1 tablet (10 mg total) by mouth daily. 90 tablet 4   fluticasone (FLONASE) 50 MCG/ACT nasal spray PLACE 2 SPRAYS INTO BOTH NOSTRILS DAILY. 16 g 6   montelukast (SINGULAIR) 10 MG tablet TAKE 1 TABLET BY MOUTH ONCE DAILY AT BEDTIME. 90 tablet 3   No current facility-administered medications for this visit.    Known medication allergies: No Known Allergies   Physical examination: Blood pressure 132/78, pulse 94, temperature (!) 97 F (36.1 C), resp. rate 18,  height 6\' 1"  (1.854 m), weight 294 lb 6.4 oz (133.5 kg), SpO2 96 %.  General: Alert, interactive, in no acute distress. HEENT: PERRLA, TMs pearly gray, turbinates moderately edematous with clear discharge, post-pharynx non erythematous. Neck: Supple without lymphadenopathy. Lungs: Clear to auscultation without wheezing, rhonchi or rales. {no increased work of breathing. CV: Normal S1, S2 without murmurs. Abdomen: Nondistended, nontender. Skin: Hyperpigmented patches to plaques across the frontal and posterior neck . Extremities:  No clubbing, cyanosis or edema. Neuro:   Grossly intact.  Diagnositics/Labs:  Allergy testing: Environmental allergy skin  prick testing is positive to Timothy, elm, beech, Curvularia and Fusarium. Intradermal testing is positive to mold mix 1 and 2. Food allergy skin prick testing to peanut and tree nut panel was negative Allergy testing results were read and interpreted by provider, documented by clinical staff.   Assessment and plan: Allergic rhinitis with conjunctivitis -environmetnal allergy testing is positive to grass pollen, tree pollen, molds -allergen avoidance measures discussed/handouts provided - recommend trial of Xyzal 5mg  daily.  This will replace Zyrtec at this time.   -trial dymista 1 spray each nostril twice a day.  This is a combination nasal spray with Flonase + Astelin (nasal antihistamine).  This helps with both nasal congestion and drainage.  -continue singulair 10mg  daily at bedtime for now -for itchy/watery eyes recommend use of Pataday 1 drop each eye daily as needed -allergen immunotherapy discussed today including protocol, benefits and risk.  Informational handout provided.  If interested in this therapuetic option you can check with your insurance carrier for coverage.  Let know if you would like to proceed with this option.    Eczema -continue moisturization after bathing -for dry, itchy, irritated, patchy, flaky or scaly areas can use Triamcinolone ointment 0.1% twice a day as needed -keep fingernails trimmed -monitor for any food that flare eczema  Oral allergy syndrome -The oral allergy syndrome (OAS) or pollen-food allergy syndrome (PFAS) is a relatively common form of food allergy, particularly in adults. It typically occurs in people who have pollen allergies when the immune system "sees" proteins on the food that look like proteins on the pollen. This results in the allergy antibody (IgE) binding to the food instead of the pollen. Patients typically report itching and/or mild swelling of the mouth and throat immediately following ingestion of certain uncooked fruits  (including nuts) or raw vegetables. Only a very small number of affected individuals experience systemic allergic reactions, such as anaphylaxis which occurs with true food allergies.  -this is most likely what is happening when you eat nuts  Follow-up in 4 months or sooner if needed   I appreciate the opportunity to take part in Devin Osborne's care. Please do not hesitate to contact me with questions.  Sincerely,   , MD Allergy/Immunology [facility]

## 2021-05-16 ENCOUNTER — Other Ambulatory Visit (HOSPITAL_COMMUNITY): Payer: Self-pay

## 2021-06-06 ENCOUNTER — Telehealth: Payer: Self-pay | Admitting: Neurology

## 2021-06-06 NOTE — Telephone Encounter (Signed)
Received note from insurance concerned about patient drinking grapefruit juice and eating grapefruit while on atorvastatin. Patient states he doesn't consume either.

## 2021-06-18 ENCOUNTER — Other Ambulatory Visit (HOSPITAL_COMMUNITY): Payer: Self-pay

## 2021-07-01 ENCOUNTER — Ambulatory Visit: Payer: 59 | Admitting: Dietician

## 2021-07-08 ENCOUNTER — Ambulatory Visit (INDEPENDENT_AMBULATORY_CARE_PROVIDER_SITE_OTHER): Payer: 59

## 2021-07-08 ENCOUNTER — Encounter: Payer: Self-pay | Admitting: Physician Assistant

## 2021-07-08 ENCOUNTER — Other Ambulatory Visit: Payer: Self-pay | Admitting: Physician Assistant

## 2021-07-08 ENCOUNTER — Other Ambulatory Visit (HOSPITAL_COMMUNITY): Payer: Self-pay

## 2021-07-08 ENCOUNTER — Other Ambulatory Visit: Payer: Self-pay

## 2021-07-08 ENCOUNTER — Ambulatory Visit (INDEPENDENT_AMBULATORY_CARE_PROVIDER_SITE_OTHER): Payer: 59 | Admitting: Physician Assistant

## 2021-07-08 VITALS — BP 148/77 | HR 77 | Ht 73.0 in | Wt 294.0 lb

## 2021-07-08 DIAGNOSIS — G8929 Other chronic pain: Secondary | ICD-10-CM

## 2021-07-08 DIAGNOSIS — M791 Myalgia, unspecified site: Secondary | ICD-10-CM | POA: Diagnosis not present

## 2021-07-08 DIAGNOSIS — E6609 Other obesity due to excess calories: Secondary | ICD-10-CM | POA: Diagnosis not present

## 2021-07-08 DIAGNOSIS — J302 Other seasonal allergic rhinitis: Secondary | ICD-10-CM

## 2021-07-08 DIAGNOSIS — M545 Low back pain, unspecified: Secondary | ICD-10-CM

## 2021-07-08 DIAGNOSIS — Z6838 Body mass index (BMI) 38.0-38.9, adult: Secondary | ICD-10-CM | POA: Diagnosis not present

## 2021-07-08 MED ORDER — ROSUVASTATIN CALCIUM 5 MG PO TABS
5.0000 mg | ORAL_TABLET | Freq: Every day | ORAL | 0 refills | Status: DC
  Filled 2021-07-08: qty 30, 30d supply, fill #0

## 2021-07-08 MED FILL — Montelukast Sodium Tab 10 MG (Base Equiv): ORAL | 90 days supply | Qty: 90 | Fill #0 | Status: AC

## 2021-07-08 NOTE — Progress Notes (Signed)
Lumbar spine looks good. Unremarkable.

## 2021-07-08 NOTE — Progress Notes (Signed)
   Subjective:    Patient ID: Devin Osborne, male    DOB: [birth year], 40 y.o.   MRN: [id number]  HPI Pt is a 40 yo obese male with HLD who presents to the clinic with right lower back pain without sciatica for last 7 months. No injury. No radiation. No bowel or bladder dysfunction. He did start exercising more and lifting more in gym. He is more active at work as well with lots of lifting, twisting, bending. Worse over last month.   He did stop lipitor and pain did get better. Off for the last 3 weeks.   .. Active Ambulatory Problems    Diagnosis Date Noted   History of nasal polyp 03/07/2013   Seasonal allergies 03/07/2013   Eczema 03/07/2013   Hyperlipidemia 10/03/2013   Constipation 06/20/2016   Hemorrhoid 06/20/2016   Non-restorative sleep 12/17/2017   Snoring 12/17/2017   Class 2 obesity due to excess calories without serious comorbidity with body mass index (BMI) of 38.0 to 38.9 in adult 12/17/2017   Essential hypertension 09/28/2019   Proteinuria 09/28/2019   Hematuria, gross 09/28/2019   Elevated blood pressure reading without diagnosis of hypertension 05/15/2020   Tinea pedis of left foot 05/15/2020   Chronic right-sided low back pain without sciatica 07/08/2021   Myalgia 07/08/2021   Resolved Ambulatory Problems    Diagnosis Date Noted   PHARYNGITIS 08/20/2010   Seasickness 06/22/2015   Thrombosed external hemorrhoid 06/23/2016   No Additional Past Medical History      Review of Systems See HPI.     Objective:   Physical Exam Vitals reviewed.  Constitutional:      Appearance: Normal appearance. He is obese.  HENT:     Head: Normocephalic.  Cardiovascular:     Rate and Rhythm: Normal rate.     Pulses: Normal pulses.  Musculoskeletal:     Right lower leg: No edema.     Left lower leg: No edema.     Comments: No calf or leg tenderness to palpation, bilaterally.  Negative SLR, bilaterally.  No tenderness over lumbar spine to palpation or  paraspinal muscles.  NROM of right hip and flexion at waist.   Neurological:     General: No focal deficit present.     Mental Status: He is alert.          Assessment & Plan:  Marland KitchenMarland KitchenLupe was seen today for back pain.  Diagnoses and all orders for this visit:  Chronic right-sided low back pain without sciatica -     DG Lumbar Spine Complete; Future  Myalgia  Other orders -     rosuvastatin (CRESTOR) 5 MG tablet; Take 1 tablet (5 mg total) by mouth daily.  Symptoms did get better over the last 3 weeks off lipitor. Stop lipitor. Start crestor and give me update. Uncommon that all myalgia in one area.  Lumbar xray ordered. No red flag signs or symptoms.  Low back stretches ordered.  Follow up as needed or if symptoms persist or worsen.  Discussed good lifting technique.

## 2021-07-08 NOTE — Patient Instructions (Signed)
Low Back Sprain or Strain Rehab Ask your health care provider which exercises are safe for you. Do exercises exactly as told by your health care provider and adjust them as directed. It is normal to feel mild stretching, pulling, tightness, or discomfort as you do these exercises. Stop right away if you feel sudden pain or your pain gets worse. Do not begin these exercises until told by your health care provider. Stretching and range-of-motion exercises These exercises warm up your muscles and joints and improve the movement and flexibility of your back. These exercises also help to relieve pain, numbness,and tingling. Lumbar rotation  Lie on your back on a firm surface and bend your knees. Straighten your arms out to your sides so each arm forms a 90-degree angle (right angle) with a side of your body. Slowly move (rotate) both of your knees to one side of your body until you feel a stretch in your lower back (lumbar). Try not to let your shoulders lift off the floor. Hold this position for __________ seconds. Tense your abdominal muscles and slowly move your knees back to the starting position. Repeat this exercise on the other side of your body. Repeat __________ times. Complete this exercise __________ times a day. Single knee to chest  Lie on your back on a firm surface with both legs straight. Bend one of your knees. Use your hands to move your knee up toward your chest until you feel a gentle stretch in your lower back and buttock. Hold your leg in this position by holding on to the front of your knee. Keep your other leg as straight as possible. Hold this position for __________ seconds. Slowly return to the starting position. Repeat with your other leg. Repeat __________ times. Complete this exercise __________ times a day. Prone extension on elbows  Lie on your abdomen on a firm surface (prone position). Prop yourself up on your elbows. Use your arms to help lift your chest up  until you feel a gentle stretch in your abdomen and your lower back. This will place some of your body weight on your elbows. If this is uncomfortable, try stacking pillows under your chest. Your hips should stay down, against the surface that you are lying on. Keep your hip and back muscles relaxed. Hold this position for __________ seconds. Slowly relax your upper body and return to the starting position. Repeat __________ times. Complete this exercise __________ times a day. Strengthening exercises These exercises build strength and endurance in your back. Endurance is theability to use your muscles for a long time, even after they get tired. Pelvic tilt This exercise strengthens the muscles that lie deep in the abdomen. Lie on your back on a firm surface. Bend your knees and keep your feet flat on the floor. Tense your abdominal muscles. Tip your pelvis up toward the ceiling and flatten your lower back into the floor. To help with this exercise, you may place a small towel under your lower back and try to push your back into the towel. Hold this position for __________ seconds. Let your muscles relax completely before you repeat this exercise. Repeat __________ times. Complete this exercise __________ times a day. Alternating arm and leg raises  Get on your hands and knees on a firm surface. If you are on a hard floor, you may want to use padding, such as an exercise mat, to cushion your knees. Line up your arms and legs. Your hands should be directly below your shoulders,   and your knees should be directly below your hips. Lift your left leg behind you. At the same time, raise your right arm and straighten it in front of you. Do not lift your leg higher than your hip. Do not lift your arm higher than your shoulder. Keep your abdominal and back muscles tight. Keep your hips facing the ground. Do not arch your back. Keep your balance carefully, and do not hold your breath. Hold this  position for __________ seconds. Slowly return to the starting position. Repeat with your right leg and your left arm. Repeat __________ times. Complete this exercise __________ times a day. Abdominal set with straight leg raise  Lie on your back on a firm surface. Bend one of your knees and keep your other leg straight. Tense your abdominal muscles and lift your straight leg up, 4-6 inches (10-15 cm) off the ground. Keep your abdominal muscles tight and hold this position for __________ seconds. Do not hold your breath. Do not arch your back. Keep it flat against the ground. Keep your abdominal muscles tense as you slowly lower your leg back to the starting position. Repeat with your other leg. Repeat __________ times. Complete this exercise __________ times a day. Single leg lower with bent knees Lie on your back on a firm surface. Tense your abdominal muscles and lift your feet off the floor, one foot at a time, so your knees and hips are bent in 90-degree angles (right angles). Your knees should be over your hips and your lower legs should be parallel to the floor. Keeping your abdominal muscles tense and your knee bent, slowly lower one of your legs so your toe touches the ground. Lift your leg back up to return to the starting position. Do not hold your breath. Do not let your back arch. Keep your back flat against the ground. Repeat with your other leg. Repeat __________ times. Complete this exercise __________ times a day. Posture and body mechanics Good posture and healthy body mechanics can help to relieve stress in your body's tissues and joints. Body mechanics refers to the movements and positions of your body while you do your daily activities. Posture is part of body mechanics. Good posture means: Your spine is in its natural S-curve position (neutral). Your shoulders are pulled back slightly. Your head is not tipped forward. Follow these guidelines to improve your posture  and body mechanics in youreveryday activities. Standing  When standing, keep your spine neutral and your feet about hip width apart. Keep a slight bend in your knees. Your ears, shoulders, and hips should line up. When you do a task in which you stand in one place for a long time, place one foot up on a stable object that is 2-4 inches (5-10 cm) high, such as a footstool. This helps keep your spine neutral.  Sitting  When sitting, keep your spine neutral and keep your feet flat on the floor. Use a footrest, if necessary, and keep your thighs parallel to the floor. Avoid rounding your shoulders, and avoid tilting your head forward. When working at a desk or a computer, keep your desk at a height where your hands are slightly lower than your elbows. Slide your chair under your desk so you are close enough to maintain good posture. When working at a computer, place your monitor at a height where you are looking straight ahead and you do not have to tilt your head forward or downward to look at the screen.    Resting When lying down and resting, avoid positions that are most painful for you. If you have pain with activities such as sitting, bending, stooping, or squatting, lie in a position in which your body does not bend very much. For example, avoid curling up on your side with your arms and knees near your chest (fetal position). If you have pain with activities such as standing for a long time or reaching with your arms, lie with your spine in a neutral position and bend your knees slightly. Try the following positions: Lying on your side with a pillow between your knees. Lying on your back with a pillow under your knees. Lifting  When lifting objects, keep your feet at least shoulder width apart and tighten your abdominal muscles. Bend your knees and hips and keep your spine neutral. It is important to lift using the strength of your legs, not your back. Do not lock your knees straight  out. Always ask for help to lift heavy or awkward objects.  This information is not intended to replace advice given to you by your health care provider. Make sure you discuss any questions you have with your healthcare provider. Document Revised: 03/11/2019 Document Reviewed: 12/09/2018 Elsevier Patient Education  2022 Elsevier Inc.  

## 2021-07-09 ENCOUNTER — Other Ambulatory Visit (HOSPITAL_COMMUNITY): Payer: Self-pay

## 2021-07-15 ENCOUNTER — Encounter: Payer: Self-pay | Admitting: Physician Assistant

## 2021-07-15 ENCOUNTER — Other Ambulatory Visit (HOSPITAL_COMMUNITY): Payer: Self-pay

## 2021-07-15 MED ORDER — CYCLOBENZAPRINE HCL 10 MG PO TABS
5.0000 mg | ORAL_TABLET | Freq: Three times a day (TID) | ORAL | 0 refills | Status: DC | PRN
  Filled 2021-07-15: qty 30, 10d supply, fill #0

## 2021-08-01 ENCOUNTER — Other Ambulatory Visit (HOSPITAL_COMMUNITY): Payer: Self-pay

## 2021-08-13 ENCOUNTER — Encounter: Payer: Self-pay | Admitting: Physician Assistant

## 2021-08-13 ENCOUNTER — Other Ambulatory Visit (HOSPITAL_COMMUNITY): Payer: Self-pay

## 2021-08-13 ENCOUNTER — Other Ambulatory Visit: Payer: Self-pay | Admitting: Physician Assistant

## 2021-08-13 MED ORDER — ROSUVASTATIN CALCIUM 5 MG PO TABS
5.0000 mg | ORAL_TABLET | Freq: Every day | ORAL | 1 refills | Status: DC
  Filled 2021-08-13: qty 90, 90d supply, fill #0

## 2021-08-21 ENCOUNTER — Other Ambulatory Visit (HOSPITAL_COMMUNITY): Payer: Self-pay

## 2021-08-22 ENCOUNTER — Other Ambulatory Visit (HOSPITAL_COMMUNITY): Payer: Self-pay

## 2021-08-30 ENCOUNTER — Other Ambulatory Visit (HOSPITAL_COMMUNITY): Payer: Self-pay

## 2021-08-30 ENCOUNTER — Other Ambulatory Visit: Payer: Self-pay | Admitting: Allergy

## 2021-09-18 ENCOUNTER — Ambulatory Visit: Payer: 59 | Admitting: Allergy

## 2021-09-30 ENCOUNTER — Other Ambulatory Visit (HOSPITAL_COMMUNITY): Payer: Self-pay

## 2021-09-30 MED ORDER — CARESTART COVID-19 HOME TEST VI KIT
PACK | 0 refills | Status: DC
Start: 2021-09-30 — End: 2021-10-07
  Filled 2021-09-30: qty 4, 4d supply, fill #0

## 2021-10-01 ENCOUNTER — Ambulatory Visit: Payer: 59 | Admitting: Dietician

## 2021-10-07 ENCOUNTER — Encounter: Payer: Self-pay | Admitting: Physician Assistant

## 2021-10-07 ENCOUNTER — Telehealth (INDEPENDENT_AMBULATORY_CARE_PROVIDER_SITE_OTHER): Payer: No Typology Code available for payment source | Admitting: Physician Assistant

## 2021-10-07 VITALS — Ht 73.0 in | Wt 294.0 lb

## 2021-10-07 DIAGNOSIS — U071 COVID-19: Secondary | ICD-10-CM

## 2021-10-07 NOTE — Progress Notes (Signed)
Covid + on Thursday  All symptoms gone - had a dry cough and headache Entire family tested positive

## 2021-10-07 NOTE — Progress Notes (Signed)
..  Virtual Visit via Video Note  I connected with Devin Osborne on 10/07/21 at  8:10 AM EST by a video enabled telemedicine application and verified that I am speaking with the correct person using two identifiers.  Location: Patient: home Provider: clinic  .Marland KitchenParticipating in visit:  Patient: Devin Osborne Provider: Tandy Gaw PA-C   I discussed the limitations of evaluation and management by telemedicine and the availability of in person appointments. The patient expressed understanding and agreed to proceed.  History of Present Illness: Pt is a 40 yo obese male with HTN who presents to the clinic to discuss covid infection. His whole family has covid. He tested positive on 11/3 but was having symptoms before. All symptoms gone today and feeling better. Dry cough remains. Ready to go back to work. Denies any SOB, headache, weakness, leg swelling, sinus pressure, ear pain. He was covid vaccinated x3.    .. Active Ambulatory Problems    Diagnosis Date Noted   History of nasal polyp 03/07/2013   Seasonal allergies 03/07/2013   Eczema 03/07/2013   Hyperlipidemia 10/03/2013   Constipation 06/20/2016   Hemorrhoid 06/20/2016   Non-restorative sleep 12/17/2017   Snoring 12/17/2017   Class 2 obesity due to excess calories without serious comorbidity with body mass index (BMI) of 38.0 to 38.9 in adult 12/17/2017   Essential hypertension 09/28/2019   Proteinuria 09/28/2019   Hematuria, gross 09/28/2019   Elevated blood pressure reading without diagnosis of hypertension 05/15/2020   Tinea pedis of left foot 05/15/2020   Chronic right-sided low back pain without sciatica 07/08/2021   Myalgia 07/08/2021   Resolved Ambulatory Problems    Diagnosis Date Noted   PHARYNGITIS 08/20/2010   Seasickness 06/22/2015   Thrombosed external hemorrhoid 06/23/2016   No Additional Past Medical History    Observations/Objective: No acute distress Normal breathing No cough or wheezing Normal  mood and appearance  .Marland Kitchen Today's Vitals   10/07/21 0805  Weight: 294 lb (133.4 kg)  Height: 6\' 1"  (1.854 m)   Body mass index is 38.79 kg/m.    Assessment and Plan: Marland KitchenKieth was seen today for covid positive.  Diagnoses and all orders for this visit:  COVID-19 virus infection  On Day 5 of testing positive for covid.  Covid vaccinated x3. Pt doing well and feels ready to go back to work.  Pt is considered higher risk and could start antiviral but doing so well and pt declined it.  Minimal dry cough. Discussed delsym and cough drops.  Discussed red flag symptoms and when to seek more care.  Note to go back to work 11/9 with mask for full 10 days.     Follow Up Instructions:    I discussed the assessment and treatment plan with the patient. The patient was provided an opportunity to ask questions and all were answered. The patient agreed with the plan and demonstrated an understanding of the instructions.   The patient was advised to call back or seek an in-person evaluation if the symptoms worsen or if the condition fails to improve as anticipated.   13/9, PA-C

## 2021-10-15 ENCOUNTER — Ambulatory Visit: Payer: 59 | Admitting: Dietician

## 2021-10-21 ENCOUNTER — Telehealth (INDEPENDENT_AMBULATORY_CARE_PROVIDER_SITE_OTHER): Payer: No Typology Code available for payment source | Admitting: Physician Assistant

## 2021-10-21 ENCOUNTER — Other Ambulatory Visit (HOSPITAL_COMMUNITY): Payer: Self-pay

## 2021-10-21 ENCOUNTER — Encounter: Payer: Self-pay | Admitting: Physician Assistant

## 2021-10-21 DIAGNOSIS — K580 Irritable bowel syndrome with diarrhea: Secondary | ICD-10-CM

## 2021-10-21 DIAGNOSIS — R140 Abdominal distension (gaseous): Secondary | ICD-10-CM

## 2021-10-21 MED ORDER — DICYCLOMINE HCL 10 MG PO CAPS
10.0000 mg | ORAL_CAPSULE | Freq: Two times a day (BID) | ORAL | 1 refills | Status: DC
  Filled 2021-10-21: qty 60, 30d supply, fill #0
  Filled 2022-01-15 – 2022-01-24 (×2): qty 60, 30d supply, fill #1

## 2021-10-21 NOTE — Patient Instructions (Addendum)
Align/Cultrelle  Bentyl twice a day Diet for Irritable Bowel Syndrome When you have irritable bowel syndrome (IBS), it is very important to eat the foods and follow the eating habits that are best for your condition. IBS may cause various symptoms such as pain in the abdomen, constipation, or diarrhea. Choosing the right foods can help to ease the discomfort from these symptoms. Work with your health care provider and diet and nutrition specialist (dietitian) to find the eating plan that will help to control your symptoms. What are tips for following this plan?   Keep a food diary. This will help you identify foods that cause symptoms. Write down: What you eat and when you eat it. What symptoms you have. When symptoms occur in relation to your meals, such as "pain in abdomen 2 hours after dinner." Eat your meals slowly and in a relaxed setting. Aim to eat 5-6 small meals per day. Do not skip meals. Drink enough fluid to keep your urine pale yellow. Ask your health care provider if you should take an over-the-counter probiotic to help restore healthy bacteria in your gut (digestive tract). Probiotics are foods that contain good bacteria and yeasts. Your dietitian may have specific dietary recommendations for you based on your symptoms. He or she may recommend that you: Avoid foods that cause symptoms. Talk with your dietitian about other ways to get the same nutrients that are in those problem foods. Avoid foods with gluten. Gluten is a protein that is found in rye, wheat, and barley. Eat more foods that contain soluble fiber. Examples of foods with high soluble fiber include oats, seeds, and certain fruits and vegetables. Take a fiber supplement if directed by your dietitian. Reduce or avoid certain foods called FODMAPs. These are foods that contain carbohydrates that are hard to digest. Ask your doctor which foods contain these carbohydrates. What foods are not recommended? The following are  some foods and drinks that may make your symptoms worse: Fatty foods, such as french fries. Foods that contain gluten, such as pasta and cereal. Dairy products, such as milk, cheese, and ice cream. Chocolate. Alcohol. Products with caffeine, such as coffee. Carbonated drinks, such as soda. Foods that are high in FODMAPs. These include certain fruits and vegetables. Products with sweeteners such as honey, high fructose corn syrup, sorbitol, and mannitol. The items listed above may not be a complete list of foods and beverages you should avoid. Contact a dietitian for more information. What foods are good sources of fiber? Your health care provider or dietitian may recommend that you eat more foods that contain fiber. Fiber can help to reduce constipation and other IBS symptoms. Add foods with fiber to your diet a little at a time so your body can get used to them. Too much fiber at one time might cause gas and swelling of your abdomen. The following are some foods that are good sources of fiber: Berries, such as raspberries, strawberries, and blueberries. Tomatoes. Carrots. Brown rice. Oats. Seeds, such as chia and pumpkin seeds. The items listed above may not be a complete list of recommended sources of fiber. Contact your dietitian for more options. Where to find more information International Foundation for Functional Gastrointestinal Disorders: www.iffgd.[employer] of Diabetes and Digestive and Kidney Diseases: CarFli[employer].tn Summary When you have irritable bowel syndrome (IBS), it is very important to eat the foods and follow the eating habits that are best for your condition. IBS may cause various symptoms such as pain in the abdomen,  constipation, or diarrhea. Choosing the right foods can help to ease the discomfort that comes from symptoms. Keep a food diary. This will help you identify foods that cause symptoms. Your health care provider or diet and nutrition  specialist (dietitian) may recommend that you eat more foods that contain fiber. This information is not intended to replace advice given to you by your health care provider. Make sure you discuss any questions you have with your health care provider. Document Revised: 07/12/2020 Document Reviewed: 07/19/2020 Elsevier Patient Education  2022 [employer].

## 2021-10-21 NOTE — Progress Notes (Signed)
Patient ID: Devin Osborne, male   DOB: Feb 14, 1981, 40 y.o.   MRN: [id number] .Marland KitchenVirtual Visit via Video Note  I connected with Rahmon Heigl Bry on 10/22/21 at  9:50 AM EST by a video enabled telemedicine application and verified that I am speaking with the correct person using two identifiers.  Location: Patient: car Provider: clinic  .Marland KitchenParticipating in visit:  Patient: Keelen Provider: Tandy Gaw PA-C Provider in training: Asher Muir PA-S   I discussed the limitations of evaluation and management by telemedicine and the availability of in person appointments. The patient expressed understanding and agreed to proceed.  History of Present Illness: Pt is a 40 yo male who calls into the clinic with intermittent loose stools, abdominal cramping, bloating for years. He does noticed some foods affect him more than others. Denies any mucus or blood in stool. He has some abdominal cramping at times but resolves after BM. Bloating is getting worse and worse. He tried a probiotic once and noticed it did help. No melena or hematochezia.   Marland Kitchen. Active Ambulatory Problems    Diagnosis Date Noted   History of nasal polyp 03/07/2013   Seasonal allergies 03/07/2013   Eczema 03/07/2013   Hyperlipidemia 10/03/2013   Constipation 06/20/2016   Hemorrhoid 06/20/2016   Non-restorative sleep 12/17/2017   Snoring 12/17/2017   Class 2 obesity due to excess calories without serious comorbidity with body mass index (BMI) of 38.0 to 38.9 in adult 12/17/2017   Essential hypertension 09/28/2019   Proteinuria 09/28/2019   Hematuria, gross 09/28/2019   Elevated blood pressure reading without diagnosis of hypertension 05/15/2020   Tinea pedis of left foot 05/15/2020   Chronic right-sided low back pain without sciatica 07/08/2021   Myalgia 07/08/2021   Irritable bowel syndrome with diarrhea 10/22/2021   Bloating 10/22/2021   Resolved Ambulatory Problems    Diagnosis Date Noted   PHARYNGITIS  08/20/2010   Seasickness 06/22/2015   Thrombosed external hemorrhoid 06/23/2016   No Additional Past Medical History       Observations/Objective: No acute distress  Normal breathing Normal mood and appearance  .Marland KitchenThere were no vitals filed for this visit. There is no height or weight on file to calculate BMI.    Assessment and Plan: Marland KitchenMarland KitchenDiagnoses and all orders for this visit:  Irritable bowel syndrome with diarrhea -     dicyclomine (BENTYL) 10 MG capsule; Take 1 capsule (10 mg total) by mouth 2 (two) times daily. -     Ambulatory referral to Gastroenterology  Bloating -     dicyclomine (BENTYL) 10 MG capsule; Take 1 capsule (10 mg total) by mouth 2 (two) times daily. -     Ambulatory referral to Gastroenterology  Sounds like IBS-D.  Discussed importance of knowing triggers and managing diet.  Provided IBS diet HO.  Start probiotic daily.  Start bentyl as needed up to twice a day.  Pt requested GI referral.  No red flags.    Follow Up Instructions:    I discussed the assessment and treatment plan with the patient. The patient was provided an opportunity to ask questions and all were answered. The patient agreed with the plan and demonstrated an understanding of the instructions.   The patient was advised to call back or seek an in-person evaluation if the symptoms worsen or if the condition fails to improve as anticipated.    Tandy Gaw, PA-C

## 2021-10-21 NOTE — Progress Notes (Signed)
Interested in getting a referral for GI. Thinks he has IBS: frequency of BM, urgency of BM, bloating, excessive gas, abdominal pain.

## 2021-10-22 DIAGNOSIS — R140 Abdominal distension (gaseous): Secondary | ICD-10-CM | POA: Insufficient documentation

## 2021-10-22 DIAGNOSIS — K580 Irritable bowel syndrome with diarrhea: Secondary | ICD-10-CM | POA: Insufficient documentation

## 2021-11-01 ENCOUNTER — Ambulatory Visit (INDEPENDENT_AMBULATORY_CARE_PROVIDER_SITE_OTHER): Payer: Self-pay | Admitting: Physician Assistant

## 2021-11-01 DIAGNOSIS — Z13220 Encounter for screening for lipoid disorders: Secondary | ICD-10-CM

## 2021-11-01 DIAGNOSIS — Z0000 Encounter for general adult medical examination without abnormal findings: Secondary | ICD-10-CM

## 2021-11-01 DIAGNOSIS — Z114 Encounter for screening for human immunodeficiency virus [HIV]: Secondary | ICD-10-CM

## 2021-11-01 DIAGNOSIS — Z13228 Encounter for screening for other metabolic disorders: Secondary | ICD-10-CM

## 2021-11-01 DIAGNOSIS — Z91199 Patient's noncompliance with other medical treatment and regimen due to unspecified reason: Secondary | ICD-10-CM

## 2021-11-01 NOTE — Progress Notes (Signed)
No show

## 2021-11-02 NOTE — Patient Instructions (Addendum)
Allergic rhinitis with conjunctivitis(positive to grass pollen, tree pollen, molds) - continue allergen avoidance measures  - continue Xyzal 5mg  daily.  Can increase to twice a day as needed to help with itching. Caution as this can make you sleepy - stop dymista due to the bad aftertaste -.  Start fluticasone nasal spray using 1 to 2 sprays each nostril once a day as needed for stuffy nose.  Try using the new technique that we discussed in the office.In the right nostril, point the applicator out toward the right ear. In the left nostril, point the applicator out toward the left ear -Start ipratropium bromide nasal spray 0.03% using 2 sprays each nostril twice a day as needed for runny nose/drainage down the throat.  Caution as this can be drying -continue singulair 10mg  daily at bedtime for now -for itchy/watery eyes recommend use of Pataday 1 drop each eye daily as needed -allergen immunotherapy discussed today including protocol, benefits and risk.  Informational handout provided.  If interested in this therapuetic option you can check with your insurance carrier for coverage.  Let know if you would like to proceed with this option.    Eczema -continue moisturization after bathing.  Try switching to Eucerin cream, Cetaphil, or CeraVe a -for dry, itchy, irritated, patchy, flaky or scaly areas can use Triamcinolone ointment 0.1% twice a day as needed -keep fingernails trimmed -monitor for any food that flare eczema  Oral allergy syndrome -The oral allergy syndrome (OAS) or pollen-food allergy syndrome (PFAS) is a relatively common form of food allergy, particularly in adults. It typically occurs in people who have pollen allergies when the immune system "sees" proteins on the food that look like proteins on the pollen. This results in the allergy antibody (IgE) binding to the food instead of the pollen. Patients typically report itching and/or mild swelling of the mouth and throat immediately  following ingestion of certain uncooked fruits (including nuts) or raw vegetables. Only a very small number of affected individuals experience systemic allergic reactions, such as anaphylaxis which occurs with true food allergies.  -this is most likely what is happening when you eat nuts  Possible gluten intolerance/bloating/gas Try avoiding gluten products and if no better discussed with GI when you are able to get the appointment  Follow-up in 3 months or sooner if needed

## 2021-11-04 ENCOUNTER — Encounter: Payer: Self-pay | Admitting: Family

## 2021-11-04 ENCOUNTER — Other Ambulatory Visit: Payer: Self-pay

## 2021-11-04 ENCOUNTER — Ambulatory Visit (INDEPENDENT_AMBULATORY_CARE_PROVIDER_SITE_OTHER): Payer: No Typology Code available for payment source | Admitting: Family

## 2021-11-04 VITALS — BP 120/80 | HR 78 | Temp 98.0°F | Ht 73.0 in | Wt 299.8 lb

## 2021-11-04 DIAGNOSIS — H1013 Acute atopic conjunctivitis, bilateral: Secondary | ICD-10-CM | POA: Diagnosis not present

## 2021-11-04 DIAGNOSIS — L2089 Other atopic dermatitis: Secondary | ICD-10-CM

## 2021-11-04 DIAGNOSIS — T781XXA Other adverse food reactions, not elsewhere classified, initial encounter: Secondary | ICD-10-CM

## 2021-11-04 DIAGNOSIS — R140 Abdominal distension (gaseous): Secondary | ICD-10-CM

## 2021-11-04 DIAGNOSIS — R143 Flatulence: Secondary | ICD-10-CM

## 2021-11-04 DIAGNOSIS — J3089 Other allergic rhinitis: Secondary | ICD-10-CM

## 2021-11-04 DIAGNOSIS — T7819XA Other adverse food reactions, not elsewhere classified, initial encounter: Secondary | ICD-10-CM

## 2021-11-04 MED ORDER — FLUTICASONE PROPIONATE 50 MCG/ACT NA SUSP: NASAL | 5 refills | Status: DC

## 2021-11-04 MED ORDER — LEVOCETIRIZINE DIHYDROCHLORIDE 5 MG PO TABS: 5.0000 mg | ORAL_TABLET | Freq: Every evening | ORAL | 1 refills | Status: DC

## 2021-11-04 MED ORDER — IPRATROPIUM BROMIDE 0.03 % NA SOLN: NASAL | 5 refills | Status: DC

## 2021-11-04 NOTE — Progress Notes (Signed)
[address] [place] [address] Dept: [phone]  FOLLOW UP NOTE  Patient ID: Devin Osborne, male    DOB: May 26, 1981  Age: 40 y.o. MRN: [id number] Date of Office Visit: 11/04/2021  Assessment  Chief Complaint: Allergic Rhinitis  (Just following up, asking about gluten intolerance /Itching, no hives, has gas and bloating.)  HPI Devin Osborne is a 40 year old male who presents today for follow-up of allergic rhinitis with conjunctivitis, eczema, and oral allergy syndrome.  He was last seen on May 15, 2021 by Dr. Delorse Lek.  He reports for the past couple years he has had off and on bloating and gas, but this has been more prevalent in the past 60 days.  He reports that he has a diagnosis of irritable bowel syndrome and is going to see a specialist soon.  He wonders if he is gluten intolerant.  He has noticed that signs can be itchy skin, bloating, and gas.  Just today as he decided to try to go gluten-free.  Allergic rhinitis with conjunctivitis is reported as moderately controlled with Xyzal 5 mg once a day, Singulair 10 mg once a day, saline rinse as needed, and Pataday eyedrops as needed.  He stopped using Dymista nasal spray approximately a month ago due to the bad aftertaste and causing an upset stomach.  Once he stopped Dymista within a few days he did not have an upset stomach.  He reports off-and-on rhinorrhea and postnasal drip a couple weeks ago.  He has not had any sinus infections since we last saw him.  Eczema is reported as being very itchy and dry.  He uses an oat based lotion for moisturization.  He also uses triamcinolone ointment as needed.  He continues to avoid pineapple.  He reports that he eats pineapple his mouth becomes itchy.   Drug Allergies:  No Known Allergies  Review of Systems: Review of Systems  Constitutional:  Negative for chills and fever.  HENT:         Reports off and on rhinorrhea and nasal congestion.  Reports postnasal  drip a couple weeks ago  Eyes:        Reports itchy watery eyes for which Pataday helps  Respiratory:  Negative for cough, shortness of breath and wheezing.   Cardiovascular:  Negative for chest pain and palpitations.  Gastrointestinal:        Reports history of IBS.  Has been having bloating and gas off and on for the past couple years, but more prevalent in the past 60 days.  Reports reflux on occasion.  Genitourinary:  Positive for frequency.       Reports increased frequency of urination on occasion  Skin:  Positive for itching.       Reports itching on his shoulders and neck for the past month  Neurological:  Positive for headaches.       Reports headaches on occasion  Endo/Heme/Allergies:  Positive for environmental allergies.   Physical Exam: BP 120/80   Pulse 78   Temp 98 F (36.7 C)   Ht 6\' 1"  (1.854 m)   Wt 299 lb 12.8 oz (136 kg)   SpO2 99%   BMI 39.55 kg/m    Physical Exam Constitutional:      Appearance: Normal appearance.  HENT:     Head: Normocephalic and atraumatic.     Comments: Pharynx normal, eyes normal, ears: Unable to see right tympanic membrane due to cerumen.  Left ear normal, nose: Bilateral lower turbinates  mildly edematous and slightly erythematous with clear drainage noted    Right Ear: Ear canal and external ear normal.     Left Ear: Tympanic membrane, ear canal and external ear normal.     Mouth/Throat:     Mouth: Mucous membranes are moist.     Pharynx: Oropharynx is clear.  Eyes:     Conjunctiva/sclera: Conjunctivae normal.  Cardiovascular:     Rate and Rhythm: Regular rhythm.     Heart sounds: Normal heart sounds.  Pulmonary:     Effort: Pulmonary effort is normal.     Breath sounds: Normal breath sounds.     Comments: Lungs clear to auscultation Musculoskeletal:     Cervical back: Neck supple.  Skin:    General: Skin is warm.     Comments: Dry skin noted on bilateral shoulders and around neck  Neurological:     Mental Status: He is  alert and oriented to person, place, and time.  Psychiatric:        Mood and Affect: Mood normal.        Behavior: Behavior normal.        Thought Content: Thought content normal.        Judgment: Judgment normal.    Diagnostics:  none  Assessment and Plan: 1. Non-seasonal allergic rhinitis due to other allergic trigger   2. Flexural atopic dermatitis   3. Pollen-food allergy, initial encounter   4. Allergic conjunctivitis of both eyes   5. Bloating   6. Flatulence     Meds ordered this encounter  Medications   levocetirizine (XYZAL) 5 MG tablet    Sig: Take 1 tablet (5 mg total) by mouth every evening.    Dispense:  90 tablet    Refill:  1   fluticasone (FLONASE) 50 MCG/ACT nasal spray    Sig: Place 2 sprays in each nostril once a day as needed for stuffy nose    Dispense:  18.2 mL    Refill:  5   ipratropium (ATROVENT) 0.03 % nasal spray    Sig: Place 2 sprays in each nostril twice a day as needed for runny nose/drainage down throat    Dispense:  30 mL    Refill:  5     Patient Instructions  Allergic rhinitis with conjunctivitis(positive to grass pollen, tree pollen, molds) - continue allergen avoidance measures  - continue Xyzal 5mg  daily.  Can increase to twice a day as needed to help with itching. Caution as this can make you sleepy - stop dymista due to the bad aftertaste -.  Start fluticasone nasal spray using 1 to 2 sprays each nostril once a day as needed for stuffy nose.  Try using the new technique that we discussed in the office.In the right nostril, point the applicator out toward the right ear. In the left nostril, point the applicator out toward the left ear -Start ipratropium bromide nasal spray 0.03% using 2 sprays each nostril twice a day as needed for runny nose/drainage down the throat.  Caution as this can be drying -continue singulair 10mg  daily at bedtime for now -for itchy/watery eyes recommend use of Pataday 1 drop each eye daily as  needed -allergen immunotherapy discussed today including protocol, benefits and risk.  Informational handout provided.  If interested in this therapuetic option you can check with your insurance carrier for coverage.  Let know if you would like to proceed with this option.    Eczema -continue moisturization after bathing.  Try switching  to Eucerin cream, Cetaphil, or CeraVe a -for dry, itchy, irritated, patchy, flaky or scaly areas can use Triamcinolone ointment 0.1% twice a day as needed -keep fingernails trimmed -monitor for any food that flare eczema  Oral allergy syndrome -The oral allergy syndrome (OAS) or pollen-food allergy syndrome (PFAS) is a relatively common form of food allergy, particularly in adults. It typically occurs in people who have pollen allergies when the immune system "sees" proteins on the food that look like proteins on the pollen. This results in the allergy antibody (IgE) binding to the food instead of the pollen. Patients typically report itching and/or mild swelling of the mouth and throat immediately following ingestion of certain uncooked fruits (including nuts) or raw vegetables. Only a very small number of affected individuals experience systemic allergic reactions, such as anaphylaxis which occurs with true food allergies.  -this is most likely what is happening when you eat nuts  Possible gluten intolerance/bloating/gas Try avoiding gluten products and if no better discussed with GI when you are able to get the appointment  Follow-up in 3 months or sooner if needed     Return in about 3 months (around 02/02/2022), or if symptoms worsen or fail to improve.    Thank you for the opportunity to care for this patient.  Please do not hesitate to contact me with questions.  Nehemiah Settle, FNP [facility]

## 2021-11-22 IMAGING — DX DG LUMBAR SPINE COMPLETE 4+V
5 series · 5 of 5 positions shown · non-contrast
Comparison: CT abdomen pelvis dated 10/05/2019.

CLINICAL DATA: Back pain.

EXAM:
LUMBAR SPINE - COMPLETE 4+ VIEW

[l-spine ap]
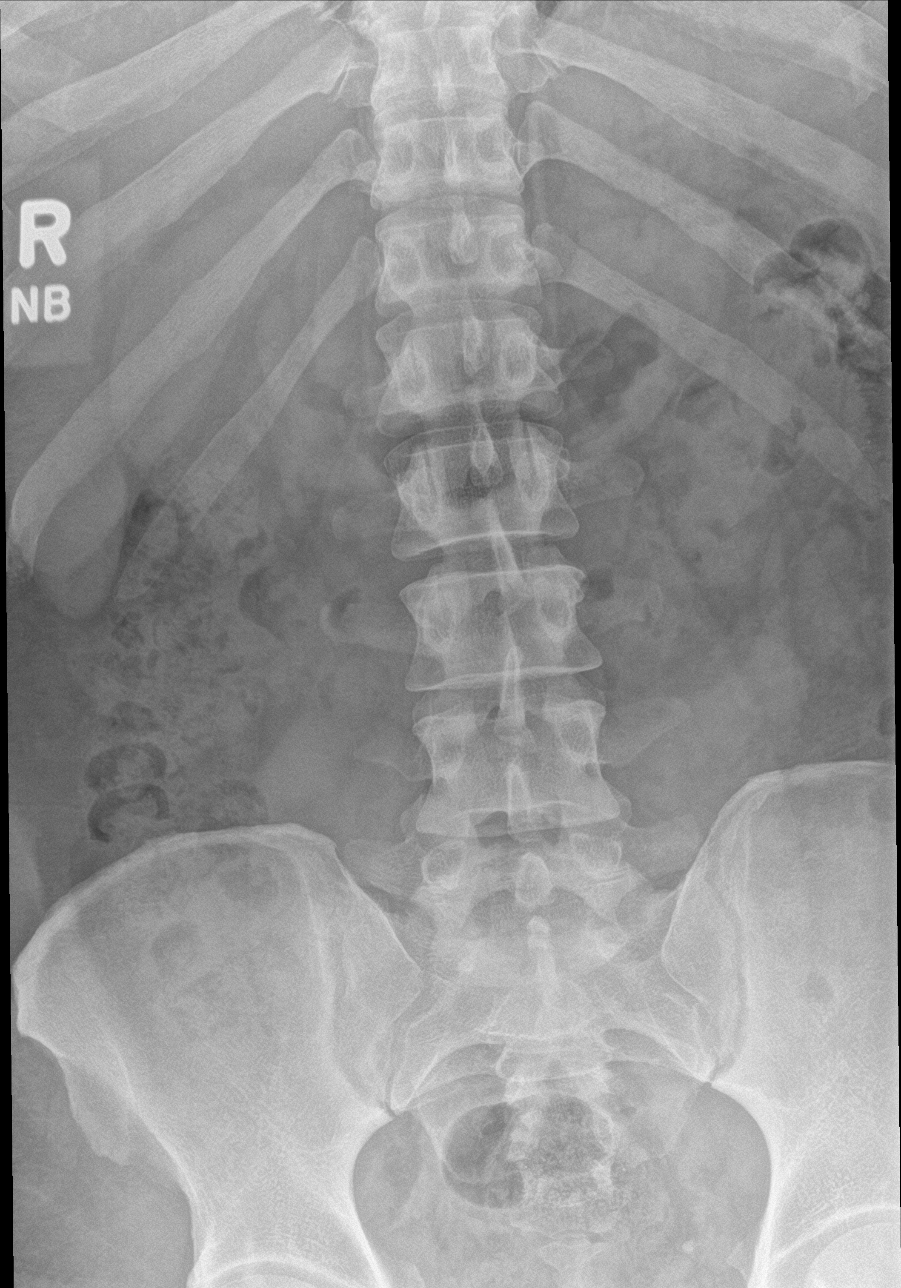

[l-spine obl (1 of 2)]
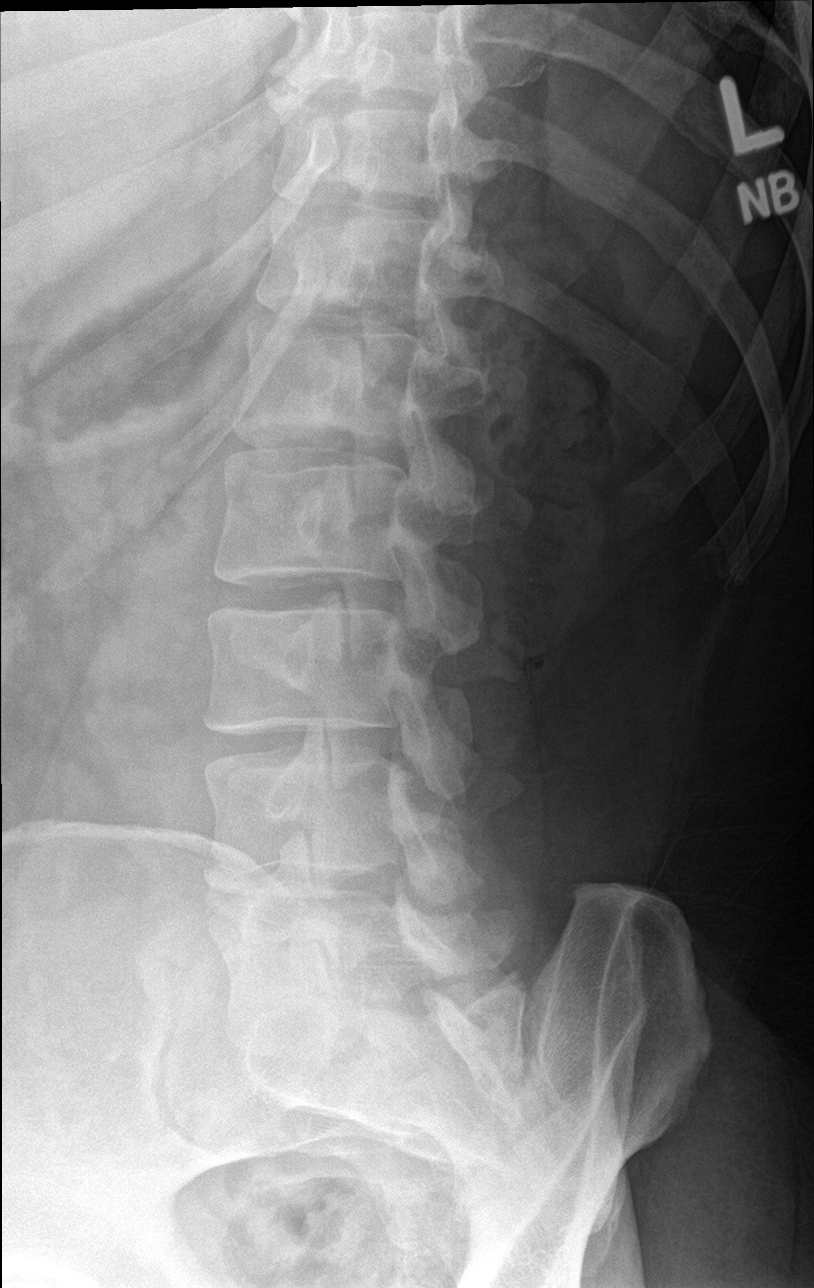

[l-spine obl (2 of 2)]
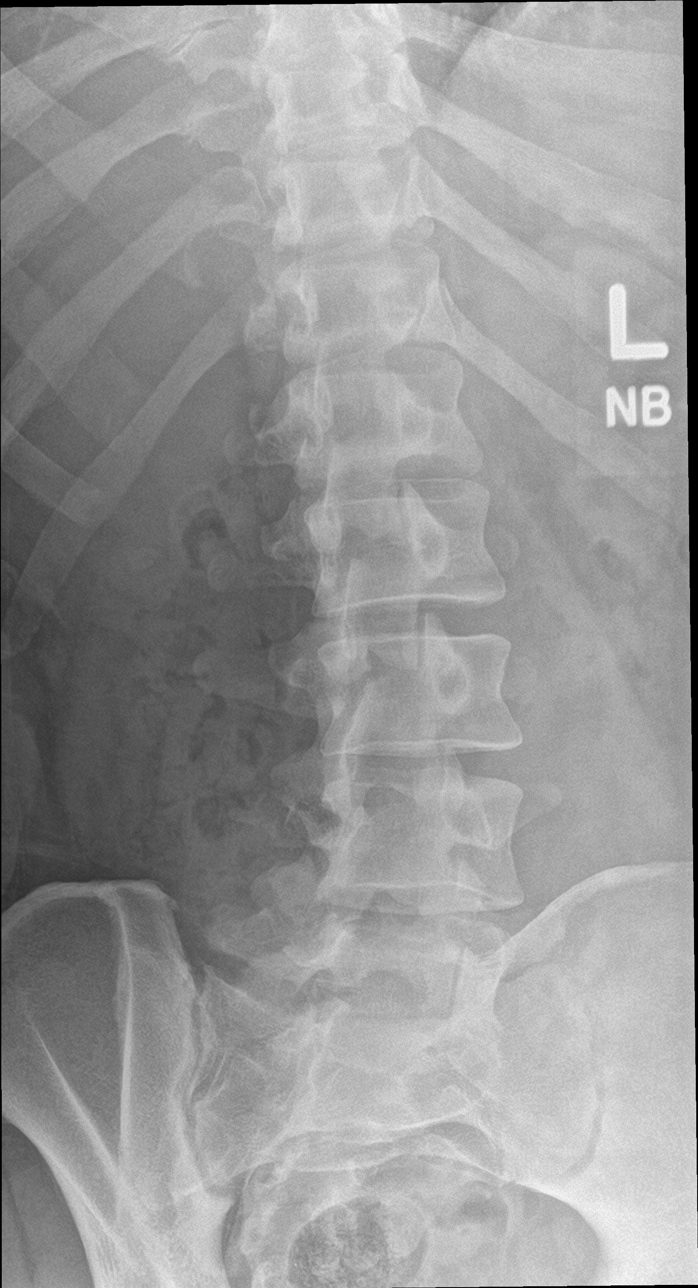

[l-spine lat]
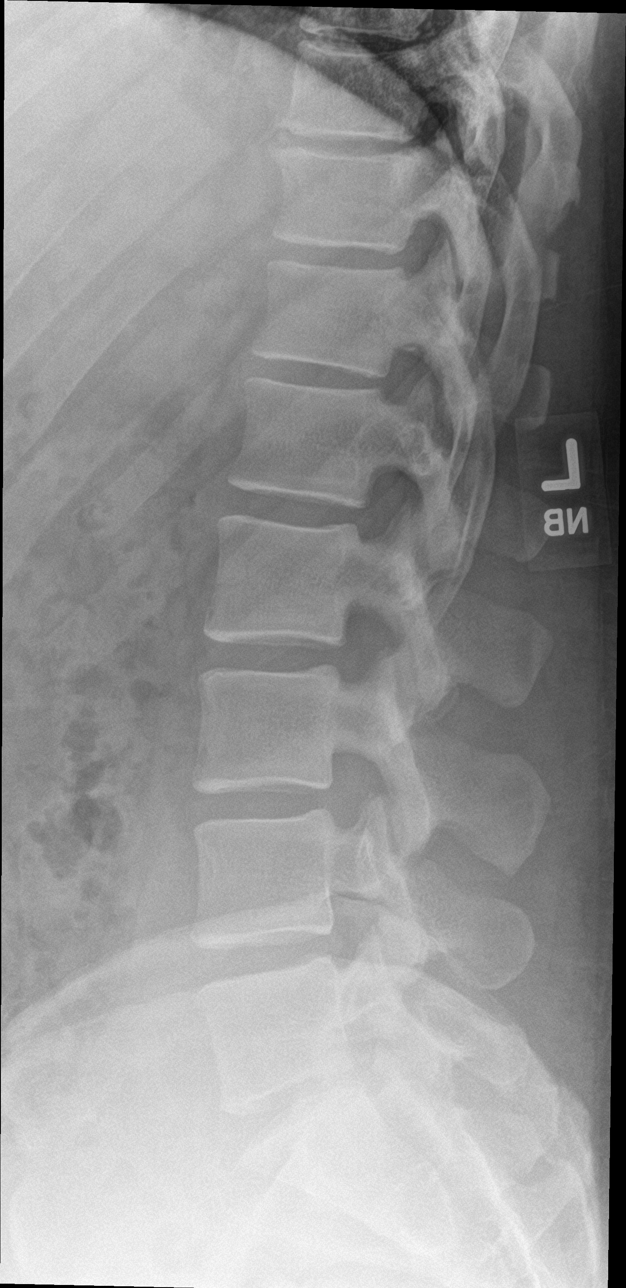

[l-spine spot]
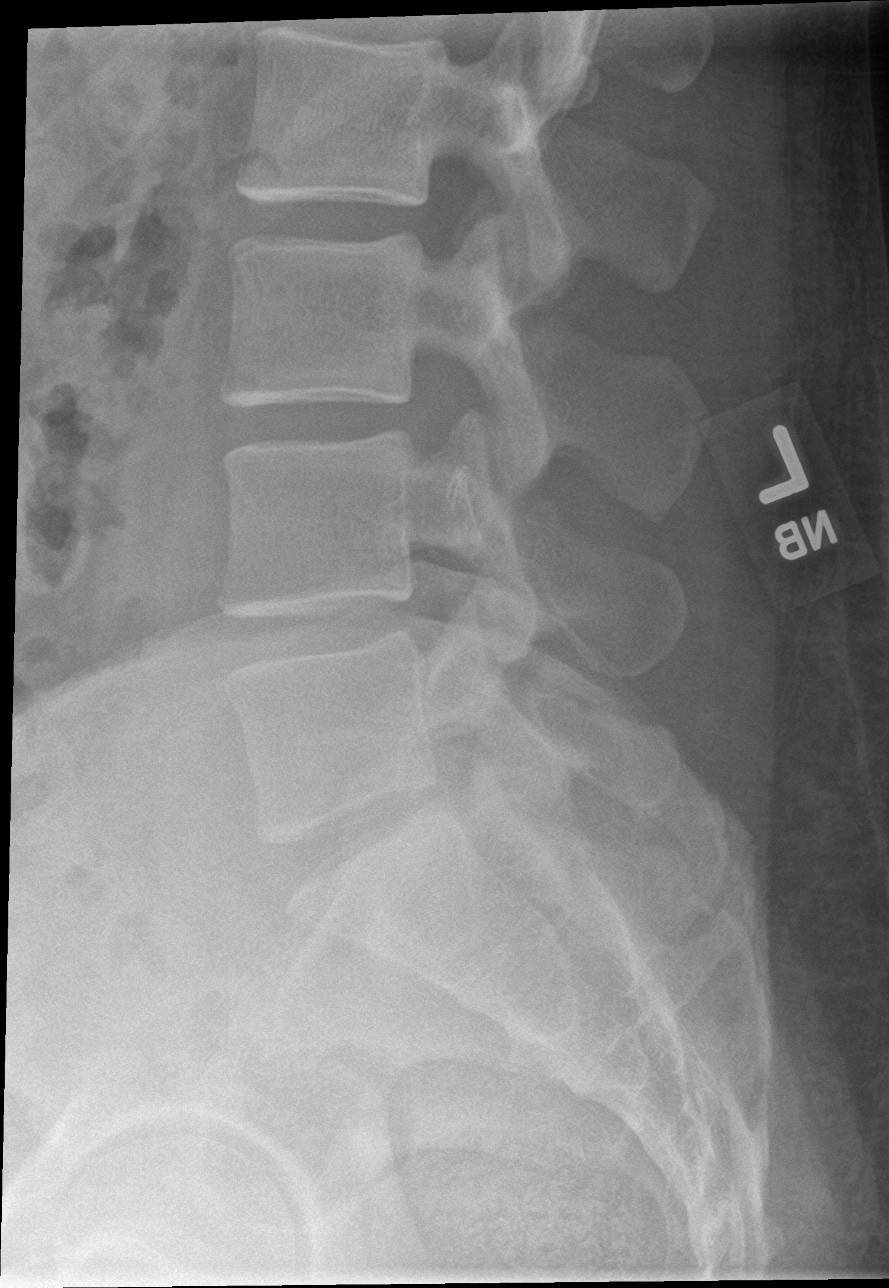

[5 of 5 positions shown; findings below may reference images not displayed]

FINDINGS: Five lumbar type vertebra. There is no acute fracture or subluxation
of the lumbar spine. The vertebral body heights and disc spaces are
maintained. The visualized posterior elements appear intact. The
soft tissues are unremarkable.
IMPRESSION: Negative.

## 2021-12-09 ENCOUNTER — Ambulatory Visit: Payer: No Typology Code available for payment source | Admitting: Dietician

## 2021-12-12 ENCOUNTER — Ambulatory Visit: Payer: No Typology Code available for payment source | Admitting: Gastroenterology

## 2022-01-16 ENCOUNTER — Other Ambulatory Visit: Payer: Self-pay | Admitting: Physician Assistant

## 2022-01-16 ENCOUNTER — Other Ambulatory Visit (HOSPITAL_COMMUNITY): Payer: Self-pay

## 2022-01-16 DIAGNOSIS — R140 Abdominal distension (gaseous): Secondary | ICD-10-CM

## 2022-01-16 DIAGNOSIS — K580 Irritable bowel syndrome with diarrhea: Secondary | ICD-10-CM

## 2022-01-24 ENCOUNTER — Other Ambulatory Visit (HOSPITAL_COMMUNITY): Payer: Self-pay

## 2022-02-06 ENCOUNTER — Ambulatory Visit: Payer: No Typology Code available for payment source | Admitting: Allergy

## 2022-02-21 ENCOUNTER — Encounter: Payer: Self-pay | Admitting: Neurology

## 2022-02-21 NOTE — Telephone Encounter (Signed)
Mychart message sent to patient.

## 2022-03-04 ENCOUNTER — Other Ambulatory Visit: Payer: Self-pay | Admitting: Physician Assistant

## 2022-04-10 ENCOUNTER — Ambulatory Visit (INDEPENDENT_AMBULATORY_CARE_PROVIDER_SITE_OTHER): Payer: No Typology Code available for payment source | Admitting: Allergy

## 2022-04-10 ENCOUNTER — Encounter: Payer: Self-pay | Admitting: Allergy

## 2022-04-10 ENCOUNTER — Other Ambulatory Visit (HOSPITAL_COMMUNITY): Payer: Self-pay

## 2022-04-10 VITALS — BP 134/88 | HR 71 | Ht 73.0 in | Wt 285.0 lb

## 2022-04-10 DIAGNOSIS — L2089 Other atopic dermatitis: Secondary | ICD-10-CM

## 2022-04-10 DIAGNOSIS — J3089 Other allergic rhinitis: Secondary | ICD-10-CM

## 2022-04-10 DIAGNOSIS — H1013 Acute atopic conjunctivitis, bilateral: Secondary | ICD-10-CM | POA: Diagnosis not present

## 2022-04-10 DIAGNOSIS — T781XXD Other adverse food reactions, not elsewhere classified, subsequent encounter: Secondary | ICD-10-CM

## 2022-04-10 MED ORDER — IPRATROPIUM BROMIDE 0.03 % NA SOLN
NASAL | 5 refills | Status: DC
  Filled 2022-04-10: qty 30, 30d supply, fill #0

## 2022-04-10 MED ORDER — LEVOCETIRIZINE DIHYDROCHLORIDE 5 MG PO TABS
5.0000 mg | ORAL_TABLET | Freq: Every evening | ORAL | 1 refills | Status: DC
  Filled 2022-04-10: qty 90, 90d supply, fill #0

## 2022-04-10 MED ORDER — MONTELUKAST SODIUM 10 MG PO TABS
ORAL_TABLET | Freq: Every day | ORAL | 3 refills | Status: DC
  Filled 2022-04-10: qty 90, 90d supply, fill #0
  Filled 2022-10-16: qty 90, fill #0
  Filled 2022-10-16: qty 90, 90d supply, fill #0
  Filled 2023-01-30: qty 90, 90d supply, fill #1

## 2022-04-10 MED ORDER — FLUTICASONE PROPIONATE 50 MCG/ACT NA SUSP
NASAL | 5 refills | Status: DC
  Filled 2022-04-10: qty 16, 60d supply, fill #0

## 2022-04-10 NOTE — Progress Notes (Signed)
? ? ?Follow-up Note ? ?RE: Devin Osborne MRN: [id number] DOB: 1981-06-26 ?Date of Office Visit: 04/10/2022 ? ? ?History of present illness: ?Devin Osborne is a 41 y.o. male presenting today for follow-up of allergic rhinitis with conjunctivitis oral allergy syndrome, eczema and food intolerance.  He was last seen in the office on 11/04/2021 by myself. ? ?He is noticing more body itching intermittently and some days may note itchy eyes.  He has on occasion taken additional dose of xyzal he believes for the itching.Marland Kitchen  ?He is taking his regimen of xyzal, singulair daily and as needed fluticasone, atrovent.  He currently does not have an allergy based eye drop.   ? ?He has triamcinolone ointment they he may need to use couple times a quarter and is effective when he does have an eczema flare.  He does report moisturizing routinely after bathing.   ? ?He does try to avoid gluten products in the diet. ?He is eating some nuts like almonds sprinkled on oatmeal or something like that as a garnished.  He states he is able to tolerate almonds. ? ?Review of systems: ?Review of Systems  ?Constitutional: Negative.   ?HENT: Negative.    ?Eyes:   ?      HPI  ?Respiratory: Negative.    ?Cardiovascular: Negative.   ?Musculoskeletal: Negative.   ?Skin: Negative.   ?     See HPI  ?Allergic/Immunologic: Negative.   ?Neurological: Negative.    ? ?All other systems negative unless noted above in HPI ? ?Past medical/social/surgical/family history have been reviewed and are unchanged unless specifically indicated below. ? ?No changes ? ?Medication List: ?Current Outpatient Medications  ?Medication Sig Dispense Refill  ? cyclobenzaprine (FLEXERIL) 10 MG tablet Take 1/2 to 1 tablet by mouth 3 times daily as needed for muscle spasms. 30 tablet 0  ? dicyclomine (BENTYL) 10 MG capsule Take 1 capsule (10 mg total) by mouth 2 (two) times daily. 60 capsule 1  ? fluticasone (FLONASE) 50 MCG/ACT nasal spray Place 2 sprays in each nostril  once a day as needed for stuffy nose 18.2 mL 5  ? ipratropium (ATROVENT) 0.03 % nasal spray Place 2 sprays in each nostril twice a day as needed for runny nose/drainage down throat 30 mL 5  ? levocetirizine (XYZAL) 5 MG tablet Take 1 tablet (5 mg total) by mouth every evening. 90 tablet 1  ? montelukast (SINGULAIR) 10 MG tablet TAKE 1 TABLET BY MOUTH ONCE DAILY AT BEDTIME. 90 tablet 3  ? Olopatadine HCl (PATADAY) 0.2 % SOLN Place 1 drop into both eyes daily as needed. 2.5 mL 5  ? rosuvastatin (CRESTOR) 5 MG tablet Take 1 tablet (5 mg total) by mouth daily. NEEDS APPT/LABS 90 tablet 0  ? triamcinolone ointment (KENALOG) 0.1 % For dry, itchy, irritated, patchy, flaky or scaly areas, apply ointment twice a day as needed. 30 g 2  ? ?No current facility-administered medications for this visit.  ?  ? ?Known medication allergies: ?No Known Allergies ? ? ?Physical examination: ?Blood pressure 134/88, pulse 71, height 6\' 1"  (1.854 m), weight 285 lb (129.3 kg), SpO2 96 %. ? ?General: Alert, interactive, in no acute distress. ?HEENT: PERRLA, TMs pearly gray, turbinates non-edematous without discharge, post-pharynx non erythematous. ?Neck: Supple without lymphadenopathy. ?Lungs: Clear to auscultation without wheezing, rhonchi or rales. {no increased work of breathing. ?CV: Normal S1, S2 without murmurs. ?Abdomen: Nondistended, nontender. ?Skin: Warm and dry, without lesions or rashes. ?Extremities:  No clubbing, cyanosis or edema. ?Neuro:  Grossly intact. ? ?Diagnositics/Labs: ?None today ? ?Assessment and plan: ?  ?Allergic rhinitis with conjunctivitis(positive to grass pollen, tree pollen, molds) ?- continue allergen avoidance measures  ?- continue Xyzal 5mg  daily.  Can increase to twice a day as needed to help with itching.  ?- use fluticasone nasal spray using 1 to 2 sprays each nostril once a day as needed for stuffy nose.   ?- use ipratropium bromide nasal spray 0.03% 2 sprays each nostril twice a day as needed for runny  nose/drainage down the throat.  Caution as this can be drying ?- continue singulair 10mg  daily at bedtime ?- for itchy/watery eyes recommend use of Pataday 1 drop each eye daily as needed ?- allergen immunotherapy discussed previously including protocol, benefits and risk.  If medication management is not effective then consider allergen immunotherapy as therapy option ? ?Eczema ?- continue moisturization after bathing.  Some good moisturizing options are Eucerin cream, Cetaphil, or CeraVe  ?- for dry, itchy, irritated, patchy, flaky or scaly areas can use Triamcinolone ointment 0.1% twice a day as needed ?- keep fingernails trimmed ?- monitor for any food that flare eczema ? ?Oral allergy syndrome ?- oral allergy syndrome (OAS) or pollen-food allergy syndrome (PFAS) is a relatively common form of food allergy, particularly in adults. It typically occurs in people who have pollen allergies when the immune system "sees" proteins on the food that look like proteins on the pollen. This results in the allergy antibody (IgE) binding to the food instead of the pollen. Patients typically report itching and/or mild swelling of the mouth and throat immediately following ingestion of certain uncooked fruits (including nuts) or raw vegetables. Only a very small number of affected individuals experience systemic allergic reactions, such as anaphylaxis which occurs with true food allergies.  ?-this is most likely what is happening when you eat certain nuts ? ?Food intolerance ?- continue to avoid gluten products  ? ?Follow-up in-4-6 months or sooner if needed  ? ?I appreciate the opportunity to take part in Keahi's care. Please do not hesitate to contact me with questions. ? ?Sincerely, ? ? ?Prudy Feeler, MD ?Allergy/Immunology ?[facility] ? ? ?

## 2022-04-10 NOTE — Patient Instructions (Addendum)
Allergic rhinitis with conjunctivitis(positive to grass pollen, tree pollen, molds) ?- continue allergen avoidance measures  ?- continue Xyzal 5mg  daily.  Can increase to twice a day as needed to help with itching.  ?- use fluticasone nasal spray using 1 to 2 sprays each nostril once a day as needed for stuffy nose.   ?- use ipratropium bromide nasal spray 0.03% 2 sprays each nostril twice a day as needed for runny nose/drainage down the throat.  Caution as this can be drying ?- continue singulair 10mg  daily at bedtime ?- for itchy/watery eyes recommend use of Pataday 1 drop each eye daily as needed ?- allergen immunotherapy discussed previously including protocol, benefits and risk.  If medication management is not effective then consider allergen immunotherapy as therapy option ? ?Eczema ?- continue moisturization after bathing.  Some good moisturizing options are Eucerin cream, Cetaphil, or CeraVe  ?- for dry, itchy, irritated, patchy, flaky or scaly areas can use Triamcinolone ointment 0.1% twice a day as needed ?- keep fingernails trimmed ?- monitor for any food that flare eczema ? ?Oral allergy syndrome ?- oral allergy syndrome (OAS) or pollen-food allergy syndrome (PFAS) is a relatively common form of food allergy, particularly in adults. It typically occurs in people who have pollen allergies when the immune system "sees" proteins on the food that look like proteins on the pollen. This results in the allergy antibody (IgE) binding to the food instead of the pollen. Patients typically report itching and/or mild swelling of the mouth and throat immediately following ingestion of certain uncooked fruits (including nuts) or raw vegetables. Only a very small number of affected individuals experience systemic allergic reactions, such as anaphylaxis which occurs with true food allergies.  ?-this is most likely what is happening when you eat certain nuts ? ?Food intolerance ?- continue to avoid gluten products   ? ?Follow-up in-4-6 months or sooner if needed  ? ? ? ?

## 2022-04-11 ENCOUNTER — Other Ambulatory Visit (HOSPITAL_COMMUNITY): Payer: Self-pay

## 2022-04-23 ENCOUNTER — Other Ambulatory Visit (HOSPITAL_COMMUNITY): Payer: Self-pay

## 2022-08-08 ENCOUNTER — Other Ambulatory Visit: Payer: Self-pay | Admitting: Physician Assistant

## 2022-08-08 DIAGNOSIS — J3089 Other allergic rhinitis: Secondary | ICD-10-CM

## 2022-08-08 DIAGNOSIS — H1013 Acute atopic conjunctivitis, bilateral: Secondary | ICD-10-CM

## 2022-09-01 ENCOUNTER — Other Ambulatory Visit (HOSPITAL_COMMUNITY): Payer: Self-pay

## 2022-09-01 MED ORDER — DICYCLOMINE HCL 20 MG PO TABS
20.0000 mg | ORAL_TABLET | Freq: Three times a day (TID) | ORAL | 0 refills | Status: AC | PRN
  Filled 2022-09-01: qty 21, 7d supply, fill #0

## 2022-09-17 ENCOUNTER — Other Ambulatory Visit (HOSPITAL_COMMUNITY): Payer: Self-pay

## 2022-09-22 ENCOUNTER — Ambulatory Visit (INDEPENDENT_AMBULATORY_CARE_PROVIDER_SITE_OTHER): Payer: No Typology Code available for payment source | Admitting: Internal Medicine

## 2022-09-22 ENCOUNTER — Encounter (INDEPENDENT_AMBULATORY_CARE_PROVIDER_SITE_OTHER): Payer: Self-pay | Admitting: Internal Medicine

## 2022-09-22 VITALS — BP 143/91 | HR 70 | Temp 98.6°F | Ht 73.0 in | Wt 285.4 lb

## 2022-09-22 DIAGNOSIS — R030 Elevated blood-pressure reading, without diagnosis of hypertension: Secondary | ICD-10-CM | POA: Diagnosis not present

## 2022-09-22 DIAGNOSIS — E7849 Other hyperlipidemia: Secondary | ICD-10-CM

## 2022-09-22 DIAGNOSIS — Z6837 Body mass index (BMI) 37.0-37.9, adult: Secondary | ICD-10-CM

## 2022-09-22 DIAGNOSIS — E669 Obesity, unspecified: Secondary | ICD-10-CM | POA: Diagnosis not present

## 2022-09-22 DIAGNOSIS — Z0289 Encounter for other administrative examinations: Secondary | ICD-10-CM

## 2022-09-22 NOTE — Progress Notes (Signed)
Office: [phone]  /  Fax: [phone]   Initial Visit  Devin Osborne was seen in clinic today to evaluate for obesity. He is interested in losing weight to improve overall health and reduce the risk of weight related complications. He presents today to review program treatment options, initial physical assessment, and evaluation.  He works as a Heritage manager at [employer].  He was referred by: Self-Referral  When asked what else they would they like to accomplish? He states: Improve existing medical conditions  When asked how has your weight affected you? He states: Contributed to medical problems and Problems with eating patterns  Some associated conditions: Hypertension and Hyperlipidemia  Contributing factors: Family history, Disruption of circadian rhythm, and Nutritional  Weight promoting medications identified: None  Current nutrition plan: None  Current level of physical activity: Walking and Sports  Current or previous pharmacotherapy: None  Response to medication: Never tried medications   Past medical history includes:   Past Medical History:  Diagnosis Date   Eczema 03/07/2013   Hemorrhoid 06/20/2016   History of nasal polyp 03/07/2013   Hyperlipidemia 10/03/2013   Seasonal allergies 03/07/2013     Objective:   BP (!) 143/91   Pulse 70   Temp 98.6 F (37 C)   Ht 6\' 1"  (1.854 m)   Wt 285 lb 6.4 oz (129.5 kg)   SpO2 98%   BMI 37.65 kg/m  He was weighed on the bioimpedance scale: Body mass index is 37.65 kg/m.  Peak Weight: 310,Visceral Fat Rating:16, Body Fat%:31.5, Weight trend over the last 12 months: Unchanged  General:  Alert, oriented and cooperative. Patient is in no acute distress.  Respiratory: Normal respiratory effort, no problems with respiration noted  Extremities: Normal range of motion.    Mental Status: Normal mood and affect. Normal behavior. Normal judgment and thought content.   Assessment and Plan:  1. Elevated blood pressure  reading without diagnosis of hypertension Blood pressure above target of less than 120/80.  I reviewed vital signs over the last 12 months and most of his readings have been within normal range.  I recommend he obtain a blood pressure machine and begin monitoring blood pressure at home.  2. Other hyperlipidemia His last LDL was 149.  He is on rosuvastatin without any side effects.  We will check a fasting lipid profile and assess cardiovascular risk.  Patient would benefit from screening for diabetes and insulin resistance.  3. Class 2 obesity without serious comorbidity with body mass index (BMI) of 37.0 to 37.9 in adult, unspecified obesity type  We reviewed weight, biometrics, associated medical conditions and contributing factors with patient. He would benefit from weight loss therapy via a modified calorie, low-carb, high-protein nutritional plan tailored to their REE (resting energy expenditure) which will be determined by indirect calorimetry.  We will also assess for cardiometabolic risk and nutritional derangements via fasting serologies at his next appointment.      Obesity Treatment / Action Plan:  Patient will work on garnering support from family and friends to begin weight loss journey. Will work on eliminating or reducing the presence of highly palatable, calorie dense foods in the home. Will complete provided nutritional and psychosocial assessment questionnaire before the next appointment. Will be scheduled for indirect calorimetry to determine resting energy expenditure in a fasting state.  This will allow to create a reduced calorie, high-protein meal plan to promote loss of fat mass while preserving muscle mass. Will think about ideas on how to  incorporate physical activity into their daily routine. Will work on reducing intake of added sugars, simple sugars and processed carbs. Was counseled on nutritional approaches to weight loss and benefits of complex carbs and high  quality protein as part of nutritional weight management.  Obesity Education Performed Today:  He was weighed on the bioimpedance scale and results were discussed and documented in the synopsis.  We discussed obesity as a disease and the importance of a more detailed evaluation of all the factors contributing to the disease.  We discussed the importance of long term lifestyle changes which include nutrition, exercise and behavioral modifications as well as the importance of customizing this to his specific health and social needs.  We discussed the benefits of reaching a healthier weight to alleviate the symptoms of existing conditions and reduce the risks of the biomechanical, metabolic and psychological effects of obesity.  Devin Osborne appears to be in the action stage of change and states they are ready to start intensive lifestyle modifications and behavioral modifications.  30 minutes was spent today on this visit including the above counseling, pre-visit chart review, and post-visit documentation.  Thomes Dinning, MD

## 2022-10-01 ENCOUNTER — Encounter (INDEPENDENT_AMBULATORY_CARE_PROVIDER_SITE_OTHER): Payer: Self-pay | Admitting: Internal Medicine

## 2022-10-01 ENCOUNTER — Ambulatory Visit (INDEPENDENT_AMBULATORY_CARE_PROVIDER_SITE_OTHER): Payer: No Typology Code available for payment source | Admitting: Internal Medicine

## 2022-10-01 VITALS — BP 148/92 | HR 54 | Temp 98.3°F | Ht 73.0 in | Wt 284.8 lb

## 2022-10-01 DIAGNOSIS — Z1331 Encounter for screening for depression: Secondary | ICD-10-CM | POA: Diagnosis not present

## 2022-10-01 DIAGNOSIS — Z6837 Body mass index (BMI) 37.0-37.9, adult: Secondary | ICD-10-CM

## 2022-10-01 DIAGNOSIS — E669 Obesity, unspecified: Secondary | ICD-10-CM

## 2022-10-01 DIAGNOSIS — R5383 Other fatigue: Secondary | ICD-10-CM | POA: Diagnosis not present

## 2022-10-01 DIAGNOSIS — R030 Elevated blood-pressure reading, without diagnosis of hypertension: Secondary | ICD-10-CM

## 2022-10-01 DIAGNOSIS — E7849 Other hyperlipidemia: Secondary | ICD-10-CM

## 2022-10-01 DIAGNOSIS — R0602 Shortness of breath: Secondary | ICD-10-CM

## 2022-10-01 DIAGNOSIS — I10 Essential (primary) hypertension: Secondary | ICD-10-CM

## 2022-10-02 LAB — HEMOGLOBIN A1C
Est. average glucose Bld gHb Est-mCnc: 126 mg/dL
Hgb A1c MFr Bld: 6 % — ABNORMAL HIGH (ref 4.8–5.6)

## 2022-10-02 LAB — LIPID PANEL WITH LDL/HDL RATIO
Cholesterol, Total: 149 mg/dL (ref 100–199)
HDL: 33 mg/dL — ABNORMAL LOW (ref >39)
LDL Chol Calc (NIH): 93 mg/dL (ref 0–99)
LDL/HDL Ratio: 2.8 ratio (ref 0.0–3.6)
Triglycerides: 124 mg/dL (ref 0–149)
VLDL Cholesterol Cal: 23 mg/dL (ref 5–40)

## 2022-10-02 LAB — VITAMIN B12: Vitamin B-12: 336 pg/mL (ref 232–1245)

## 2022-10-02 LAB — VITAMIN D 25 HYDROXY (VIT D DEFICIENCY, FRACTURES): Vit D, 25-Hydroxy: 23.6 ng/mL — ABNORMAL LOW (ref 30.0–100.0)

## 2022-10-02 LAB — TSH: TSH: 2.01 u[IU]/mL (ref 0.450–4.500)

## 2022-10-02 LAB — INSULIN, RANDOM: INSULIN: 16.7 u[IU]/mL (ref 2.6–24.9)

## 2022-10-13 NOTE — Progress Notes (Unsigned)
Chief Complaint:   OBESITY Devin Osborne (MR# [id number]) is a 41 y.o. male who presents for evaluation and treatment of obesity and related comorbidities. Current BMI is Body mass index is 37.57 kg/m. Devin Osborne has been struggling with his weight for many years and has been unsuccessful in either losing weight, maintaining weight loss, or reaching his healthy weight goal.  Devin Osborne is currently in the action stage of change and ready to dedicate time achieving and maintaining a healthier weight. Devin Osborne is interested in becoming our patient and working on intensive lifestyle modifications including (but not limited to) diet and exercise for weight loss.  Devin Osborne's habits were reviewed today and are as follows: {MWM WT HABITS:23461}.  Depression Screen [employer] Food and Mood (modified PHQ-9) score was 8.     10/01/2022    8:31 AM  Depression screen PHQ 2/9  Decreased Interest 1  Down, Depressed, Hopeless 0  PHQ - 2 Score 1  Altered sleeping 2  Tired, decreased energy 1  Change in appetite 1  Feeling bad or failure about yourself  1  Trouble concentrating 2  Moving slowly or fidgety/restless 0  Suicidal thoughts 0  PHQ-9 Score 8  Difficult doing work/chores Not difficult at all   Subjective:   1. Other fatigue [name] {Actions; denies/reports/admits to:19208} daytime somnolence and {Actions; denies/reports/admits to:19208} waking up still tired. Patient has a history of symptoms of {OSA Sx:17850}. [name] generally gets {numbers (fuzzy):14653} hours of sleep per night, and states that he has {sleep quality:17851}. Snoring {is/are not:32546} present. Apneic episodes {is/are not:32546} present. Epworth Sleepiness Score is 3.   2. SOB (shortness of breath) [name] notes increasing shortness of breath with exercising and seems to be worsening over time with weight gain. He notes getting out of breath sooner with activity than he used to. This has not gotten worse recently.  [name] denies shortness of breath at rest or orthopnea.  3. Other hyperlipidemia ***  4. Hypertension, essential ***  Assessment/Plan:   1. Other fatigue Devin Osborne does feel that his weight is causing his energy to be lower than it should be. Fatigue may be related to obesity, depression or many other causes. Labs will be ordered, and in the meanwhile, [name] will focus on self care including making healthy food choices, increasing physical activity and focusing on stress reduction.  - EKG 12-Lead - Hemoglobin A1c - Insulin, random - TSH - Vitamin B12 - VITAMIN D 25 Hydroxy (Vit-D Deficiency, Fractures)  2. SOB (shortness of breath) Devin Osborne does feel that he gets out of breath more easily that he used to when he exercises. Devin Osborne's shortness of breath appears to be obesity related and exercise induced. He has agreed to work on weight loss and gradually increase exercise to treat his exercise induced shortness of breath. Will continue to monitor closely.  3. Other hyperlipidemia *** - Lipid Panel With LDL/HDL Ratio  4. Hypertension, essential ***  5. Depression screening Devin Osborne had a positive depression screening. Depression is commonly associated with obesity and often results in emotional eating behaviors. We will monitor this closely and work on CBT to help improve the non-hunger eating patterns. Referral to Psychology may be required if no improvement is seen as he continues in our clinic.  6. Class 2 obesity without serious comorbidity with body mass index (BMI) of 37.0 to 37.9 in adult, unspecified obesity type Devin Osborne is currently in the action stage of change and his goal is to continue with weight loss efforts.  I recommend Devin Osborne begin the structured treatment plan as follows:  He has agreed to the Category 3 Plan.  Exercise goals: As is.    Behavioral modification strategies: increasing lean protein intake, decreasing simple carbohydrates, decreasing eating out, no  skipping meals, meal planning and cooking strategies, keeping healthy foods in the home, better snacking choices, avoiding temptations, and planning for success.  He was informed of the importance of frequent follow-up visits to maximize his success with intensive lifestyle modifications for his multiple health conditions. He was informed we would discuss his lab results at his next visit unless there is a critical issue that needs to be addressed sooner. [name] agreed to keep his next visit at the agreed upon time to discuss these results.  Objective:   Blood pressure (!) 148/92, pulse (!) 54, temperature 98.3 F (36.8 C), height 6\' 1"  (1.854 m), weight 284 lb 12.8 oz (129.2 kg), SpO2 96 %. Body mass index is 37.57 kg/m.  EKG: Normal sinus rhythm, rate 68 BPM.  Indirect Calorimeter completed today shows a VO2 of 338 and a REE of 2333.  His calculated basal metabolic rate is thus his basal metabolic rate is worse than expected.  General: Cooperative, alert, well developed, in no acute distress. HEENT: Conjunctivae and lids unremarkable. Cardiovascular: Regular rhythm.  Lungs: Normal work of breathing. Neurologic: No focal deficits.   Lab Results  Component Value Date   CREATININE 1.07 12/12/2020   BUN 11 12/12/2020   NA 141 12/12/2020   K 4.1 12/12/2020   CL 102 12/12/2020   CO2 28 12/12/2020   Lab Results  Component Value Date   ALT 27 12/12/2020   AST 23 12/12/2020   ALKPHOS 93 12/12/2020   BILITOT 0.7 12/12/2020   Lab Results  Component Value Date   HGBA1C 6.0 (H) 10/01/2022   Lab Results  Component Value Date   INSULIN 16.7 10/01/2022   Lab Results  Component Value Date   TSH 2.010 10/01/2022   Lab Results  Component Value Date   CHOL 149 10/01/2022   HDL 33 (L) 10/01/2022   LDLCALC 93 10/01/2022   TRIG 124 10/01/2022   CHOLHDL 6.0 (H) 05/15/2020   Lab Results  Component Value Date   WBC 9.3 05/15/2020   HGB 15.3 05/15/2020   HCT 43.8 05/15/2020    MCV 84.7 05/15/2020   PLT 241 05/15/2020   No results found for: "IRON", "TIBC", "FERRITIN"  Attestation Statements:   Reviewed by clinician on day of visit: allergies, medications, problem list, medical history, surgical history, family history, social history, and previous encounter notes.  Time spent on visit including pre-visit chart review and post-visit charting and care was 40 minutes.   I, 05/17/2020, am acting as transcriptionist for Burt Knack, MD.  I have reviewed the above documentation for accuracy and completeness, and I agree with the above. - ***

## 2022-10-15 ENCOUNTER — Encounter (INDEPENDENT_AMBULATORY_CARE_PROVIDER_SITE_OTHER): Payer: Self-pay | Admitting: Internal Medicine

## 2022-10-15 ENCOUNTER — Ambulatory Visit (INDEPENDENT_AMBULATORY_CARE_PROVIDER_SITE_OTHER): Payer: No Typology Code available for payment source | Admitting: Internal Medicine

## 2022-10-15 ENCOUNTER — Encounter: Payer: Self-pay | Admitting: Allergy

## 2022-10-15 ENCOUNTER — Ambulatory Visit (INDEPENDENT_AMBULATORY_CARE_PROVIDER_SITE_OTHER): Payer: No Typology Code available for payment source | Admitting: Allergy

## 2022-10-15 VITALS — BP 114/86 | HR 70 | Temp 97.8°F | Ht 73.0 in | Wt 282.0 lb

## 2022-10-15 VITALS — BP 118/80 | HR 68 | Temp 98.2°F | Resp 16

## 2022-10-15 DIAGNOSIS — L2089 Other atopic dermatitis: Secondary | ICD-10-CM

## 2022-10-15 DIAGNOSIS — E785 Hyperlipidemia, unspecified: Secondary | ICD-10-CM

## 2022-10-15 DIAGNOSIS — E669 Obesity, unspecified: Secondary | ICD-10-CM

## 2022-10-15 DIAGNOSIS — R030 Elevated blood-pressure reading, without diagnosis of hypertension: Secondary | ICD-10-CM

## 2022-10-15 DIAGNOSIS — Z6837 Body mass index (BMI) 37.0-37.9, adult: Secondary | ICD-10-CM

## 2022-10-15 DIAGNOSIS — R7303 Prediabetes: Secondary | ICD-10-CM

## 2022-10-15 DIAGNOSIS — T781XXD Other adverse food reactions, not elsewhere classified, subsequent encounter: Secondary | ICD-10-CM

## 2022-10-15 DIAGNOSIS — H1013 Acute atopic conjunctivitis, bilateral: Secondary | ICD-10-CM

## 2022-10-15 DIAGNOSIS — L299 Pruritus, unspecified: Secondary | ICD-10-CM | POA: Diagnosis not present

## 2022-10-15 DIAGNOSIS — J3089 Other allergic rhinitis: Secondary | ICD-10-CM | POA: Diagnosis not present

## 2022-10-15 DIAGNOSIS — E559 Vitamin D deficiency, unspecified: Secondary | ICD-10-CM | POA: Diagnosis not present

## 2022-10-15 MED ORDER — TRIAMCINOLONE ACETONIDE 0.1 % EX OINT
1.0000 | TOPICAL_OINTMENT | Freq: Two times a day (BID) | CUTANEOUS | 3 refills | Status: DC
  Filled 2022-10-15: qty 454, 30d supply, fill #0
  Filled 2022-10-16: qty 45, 30d supply, fill #0

## 2022-10-15 MED ORDER — ERGOCALCIFEROL 1.25 MG (50000 UT) PO CAPS: 50000.0000 [IU] | ORAL_CAPSULE | ORAL | 0 refills | Status: DC

## 2022-10-15 MED ORDER — PIMECROLIMUS 1 % EX CREA
TOPICAL_CREAM | Freq: Two times a day (BID) | CUTANEOUS | 5 refills | Status: DC
  Filled 2022-10-15: qty 30, 30d supply, fill #0

## 2022-10-15 MED ORDER — METFORMIN HCL ER 500 MG PO TB24: 500.0000 mg | ORAL_TABLET | Freq: Every day | ORAL | 0 refills | Status: DC

## 2022-10-15 NOTE — Patient Instructions (Addendum)
Allergic rhinitis with conjunctivitis (positive to grass pollen, tree pollen, molds) - continue allergen avoidance measures  - continue Xyzal 5mg  daily.  Can increase to twice a day as needed to help with itching.  - continue fluticasone nasal spray using 1 to 2 sprays each nostril once a day as needed for stuffy nose.   - continue ipratropium bromide nasal spray 0.03% 2 sprays each nostril twice a day as needed for runny nose/drainage down the throat.   - continue singulair 10mg  daily at bedtime - for itchy/watery eyes recommend use of Pataday 1 drop each eye daily as needed - allergen immunotherapy discussed previously including protocol, benefits and risk.  If medication management is not effective then consider allergen immunotherapy as therapy option  Eczema - continue moisturization after bathing.  Some good moisturizing options are Eucerin cream, Cetaphil, or CeraVe.   Will send in triamcinolone cream that can be mixed equally with your cream moisturizer for daily application.   - for dry, itchy, irritated, patchy, flaky or scaly areas can use Triamcinolone ointment 0.1% twice a day as needed - for itchy, dry, irritated, patchy, flaky or scaly areas can use non-steroid ointment Protopic or Elidel.   - keep fingernails trimmed - monitor for any food that flare eczema - if symptoms continue then would consider add-on therapy like Dupixent or Adbry.  Brochure provided.   - consider patch testing is the test of choice to evaluate for contact dermatitis.  Patches are best placed on a Monday with return to office on Wednesday and Friday of same week for readings.  Once patches are in place to do not get them wet.  You can take antihistamines while patches are in place.   True Test looks for the following sensitivities:      Oral allergy syndrome - oral allergy syndrome (OAS) or pollen-food allergy syndrome (PFAS) is a relatively common form of food allergy, particularly in adults. It  typically occurs in people who have pollen allergies when the immune system "sees" proteins on the food that look like proteins on the pollen. This results in the allergy antibody (IgE) binding to the food instead of the pollen. Patients typically report itching and/or mild swelling of the mouth and throat immediately following ingestion of certain uncooked fruits (including nuts) or raw vegetables. Only a very small number of affected individuals experience systemic allergic reactions, such as anaphylaxis which occurs with true food allergies.  -this is most likely what is happening when you eat certain nuts  Food intolerance - continue to avoid gluten products   Follow-up in 4-6 months or sooner if needed

## 2022-10-15 NOTE — Progress Notes (Signed)
Follow-up Note  RE: Devin Osborne MRN: [id number] DOB: [birth year] Date of Office Visit: 10/15/2022   History of present illness: Devin Osborne is a 41 y.o. male presenting today for follow-up of allergic rhinitis with conjunctivitis, eczema, oral allergy syndrome, food intolerance.  He was last seen in the office 04/10/2022 by myself.  He states the itching has been the same since last visit.  This is most troublesome concern at this time.  The itching can migrate around his body but could be all at once.  He does have a eczema patch on his neck however the itch is more bothersome than the rash itself.  He has triamcinolone but use as needed. He has been switching between moisturizers states the current one is a Statistician version of Gold [employer].  He recalls when he was younger sting the dermatologist that he had recommended a mixture of some cream to apply that worked quite well. He states he started a weight loss journey and has changed his diet over past 2 weeks and he does feel the itching is not as bad.  He cannot pinpoint what changes he may do his diet that may have helped the itching.  He states he is watching what he is eating and has reduced his portion size.   He states Xyzal is still helpful for allergy symptom control.  There were times he did need to take additional xyzal dose.  The nose sprays he uses as needed and they do are helpful.  He states right now he is not having significant allergy symptoms.  He is still avoiding gluten products in diet.    Review of systems: Review of Systems  Constitutional: Negative.   HENT: Negative.    Eyes: Negative.   Respiratory: Negative.    Cardiovascular: Negative.   Musculoskeletal: Negative.   Skin:        See HPI  Allergic/Immunologic: Negative.   Neurological: Negative.      All other systems negative unless noted above in HPI  Past medical/social/surgical/family history have been reviewed and are unchanged unless  specifically indicated below.  No changes  Medication List: Current Outpatient Medications  Medication Sig Dispense Refill   dicyclomine (BENTYL) 20 MG tablet Take 1 tablet (20 mg total) by mouth 3 (three) times daily as needed for abdominal cramp for 7 days 21 tablet 0   ergocalciferol (VITAMIN D2) 1.25 MG (50000 UT) capsule Take 1 capsule (50,000 Units total) by mouth once a week. 12 capsule 0   ipratropium (ATROVENT) 0.03 % nasal spray Place 2 sprays in each nostril twice a day as needed for runny nose/drainage down throat 30 mL 5   levocetirizine (XYZAL) 5 MG tablet Take 1 tablet  by mouth every evening. 90 tablet 1   metFORMIN (GLUCOPHAGE-XR) 500 MG 24 hr tablet Take 1 tablet (500 mg total) by mouth daily with breakfast. 30 tablet 0   montelukast (SINGULAIR) 10 MG tablet TAKE 1 TABLET BY MOUTH ONCE DAILY AT BEDTIME. 90 tablet 3   pimecrolimus (ELIDEL) 1 % cream Apply topically 2 (two) times daily. 30 g 5   rosuvastatin (CRESTOR) 5 MG tablet Take 1 tablet (5 mg total) by mouth daily. NEEDS APPT/LABS 90 tablet 0   triamcinolone cream (KENALOG) 0.1 % Apply 1 Application topically 2 (two) times daily. 453.6 g 3   fluticasone (FLONASE) 50 MCG/ACT nasal spray Place 2 sprays in each nostril once a day as needed for stuffy nose (Patient not taking: Reported on  10/15/2022) 16 g 5   No current facility-administered medications for this visit.     Known medication allergies: No Known Allergies   Physical examination: Blood pressure 118/80, pulse 68, temperature 98.2 F (36.8 C), temperature source Temporal, resp. rate 16, SpO2 96 %.  General: Alert, interactive, in no acute distress. HEENT: PERRLA, TMs pearly gray, turbinates non-edematous without discharge, post-pharynx non erythematous. Neck: Supple without lymphadenopathy. Lungs: Clear to auscultation without wheezing, rhonchi or rales. {no increased work of breathing. CV: Normal S1, S2 without murmurs. Abdomen: Nondistended,  nontender. Skin: Dry, mildly hyperpigmented, mildly thickened patches on the left lateral neck . Extremities:  No clubbing, cyanosis or edema. Neuro:   Grossly intact.  Diagnositics/Labs: None today  Assessment and plan: Allergic rhinitis with conjunctivitis (positive to grass pollen, tree pollen, molds) - continue allergen avoidance measures  - continue Xyzal 5mg  daily.  Can increase to twice a day as needed to help with itching.  - continue fluticasone nasal spray using 1 to 2 sprays each nostril once a day as needed for stuffy nose.   - continue ipratropium bromide nasal spray 0.03% 2 sprays each nostril twice a day as needed for runny nose/drainage down the throat.   - continue singulair 10mg  daily at bedtime - for itchy/watery eyes recommend use of Pataday 1 drop each eye daily as needed - allergen immunotherapy discussed previously including protocol, benefits and risk.  If medication management is not effective then consider allergen immunotherapy as therapy option  Eczema Pruritus - continue moisturization after bathing.  Some good moisturizing options are Eucerin cream, Cetaphil, or CeraVe.   Will send in triamcinolone cream that can be mixed equally with your cream moisturizer for daily application.   - for dry, itchy, irritated, patchy, flaky or scaly areas can use Triamcinolone ointment 0.1% twice a day as needed - for itchy, dry, irritated, patchy, flaky or scaly areas can use non-steroid ointment Protopic or Elidel.   - keep fingernails trimmed - monitor for any food that flare eczema - if symptoms continue then would consider add-on therapy like Dupixent or Adbry.  Brochure provided.   - consider patch testing is the test of choice to evaluate for contact dermatitis.  Patches are best placed on a Monday with return to office on Wednesday and Friday of same week for readings.  Once patches are in place to do not get them wet.  You can take antihistamines while patches are in  place.  Oral allergy syndrome - oral allergy syndrome (OAS) or pollen-food allergy syndrome (PFAS) is a relatively common form of food allergy, particularly in adults. It typically occurs in people who have pollen allergies when the immune system "sees" proteins on the food that look like proteins on the pollen. This results in the allergy antibody (IgE) binding to the food instead of the pollen. Patients typically report itching and/or mild swelling of the mouth and throat immediately following ingestion of certain uncooked fruits (including nuts) or raw vegetables. Only a very small number of affected individuals experience systemic allergic reactions, such as anaphylaxis which occurs with true food allergies.  -this is most likely what is happening when you eat certain nuts  Food intolerance - continue to avoid gluten products   Follow-up in 4-6 months or sooner if needed  I appreciate the opportunity to take part in Roi's care. Please do not hesitate to contact me with questions.  Sincerely,   Tuesday, MD Allergy/Immunology [facility]

## 2022-10-16 ENCOUNTER — Other Ambulatory Visit (INDEPENDENT_AMBULATORY_CARE_PROVIDER_SITE_OTHER): Payer: Self-pay | Admitting: Internal Medicine

## 2022-10-16 ENCOUNTER — Telehealth (INDEPENDENT_AMBULATORY_CARE_PROVIDER_SITE_OTHER): Payer: Self-pay | Admitting: Internal Medicine

## 2022-10-16 ENCOUNTER — Other Ambulatory Visit (HOSPITAL_COMMUNITY): Payer: Self-pay

## 2022-10-16 DIAGNOSIS — E559 Vitamin D deficiency, unspecified: Secondary | ICD-10-CM | POA: Insufficient documentation

## 2022-10-16 DIAGNOSIS — E785 Hyperlipidemia, unspecified: Secondary | ICD-10-CM | POA: Insufficient documentation

## 2022-10-16 DIAGNOSIS — R030 Elevated blood-pressure reading, without diagnosis of hypertension: Secondary | ICD-10-CM | POA: Insufficient documentation

## 2022-10-16 DIAGNOSIS — R7303 Prediabetes: Secondary | ICD-10-CM | POA: Insufficient documentation

## 2022-10-16 NOTE — Telephone Encounter (Signed)
Spoke with pt, he had accidentally requested a refill on the rx that was sent in yesterday. Everything is good at this point.

## 2022-10-16 NOTE — Telephone Encounter (Signed)
Patient stated his Vitamin D was denied. It was prescribed at his visit yesterday 11/15. He would like a call back to speak with a CMA

## 2022-10-22 ENCOUNTER — Other Ambulatory Visit (INDEPENDENT_AMBULATORY_CARE_PROVIDER_SITE_OTHER): Payer: Self-pay

## 2022-10-22 ENCOUNTER — Other Ambulatory Visit (HOSPITAL_COMMUNITY): Payer: Self-pay

## 2022-10-22 ENCOUNTER — Telehealth (INDEPENDENT_AMBULATORY_CARE_PROVIDER_SITE_OTHER): Payer: Self-pay | Admitting: Internal Medicine

## 2022-10-22 DIAGNOSIS — R7303 Prediabetes: Secondary | ICD-10-CM

## 2022-10-22 MED ORDER — METFORMIN HCL ER 500 MG PO TB24
500.0000 mg | ORAL_TABLET | Freq: Every day | ORAL | 0 refills | Status: DC
  Filled 2022-10-22: qty 30, 30d supply, fill #0

## 2022-10-22 NOTE — Telephone Encounter (Signed)
Pt called 11/22 was given a prescription for Metformin to try for 30 days but the mail order only do 90 day supply.He would like for a 30 day supply to be called in to the  [facility].

## 2022-10-22 NOTE — Telephone Encounter (Signed)
Spoke to pt, sent 30 day

## 2022-11-01 NOTE — Progress Notes (Unsigned)
Chief Complaint:   OBESITY Devin Osborne is here to discuss his progress with his obesity treatment plan along with follow-up of his obesity related diagnoses. Devin Osborne is on the Category 3 Plan and states he is following his eating plan approximately 90% of the time. Devin Osborne states he is playing basketball and doing cardio 30-120 minutes 1-2 times per week.  Today's visit was #: 2 Starting weight: 284 lbs Starting date: 10/01/2022 Today's weight: 282 lbs Today's date: 10/15/2022 Total lbs lost to date: 2 Total lbs lost since last in-office visit: 2  Interim History: Pt doing well. He notes decreased cravings and adequate satiety and satiation. Pt is not skipping meals and making healthier choices when eating out. He has some boredom with breakfast options.  Subjective:   1. Elevated BP without diagnosis of hypertension Discussed labs with patient today. Repeat BP 136/92. Pt monitors BP at home and reports in the 110-120's. He notes elevations at work but not sustained. Recent renal parameters within normal limits.   2. Dyslipidemia Discussed labs with patient today. LDL 99 and HDL in the 30's; likely genetic. Pt is on rosuvastatin moderate intensity.  3. Prediabetes Discussed labs with patient today. Devin Osborne's A1c is 6.0 with an insulin level of 16.7. Devin Osborne has a diagnosis of prediabetes based on his elevated HgA1c and was informed this puts him at greater risk of developing diabetes. He continues to work on diet and exercise to decrease his risk of diabetes. He denies nausea or hypoglycemia.  4. Vitamin D deficiency Vit D level 23.6. He is currently taking no vitamin D supplement. He denies nausea, vomiting or muscle weakness.  Assessment/Plan:   1. Elevated BP without diagnosis of hypertension CTM. Continue weight loss therapy and decrease sodium intake in diet.  2. Dyslipidemia Continue rosuvastatin adequate dose for risk category.  3. Prediabetes Weight loss therapy  of 10-15%. After discussion, we will start Metformin XR 500 mg once a day for prevention and weight loss management.  4. Vitamin D deficiency Low Vitamin D level contributes to fatigue and are associated with obesity, breast, and colon cancer. He agrees to start prescription Vitamin D 50,000 IU every week for 3 months and will follow-up for routine testing of Vitamin D, at least 2-3 times per year to avoid over-replacement.  Start- ergocalciferol (VITAMIN D2) 1.25 MG (50000 UT) capsule; Take 1 capsule (50,000 Units total) by mouth once a week.  Dispense: 12 capsule; Refill: 0  5. Obesity, current BMI 37.2 Devin Osborne is currently in the action stage of change. As such, his goal is to continue with weight loss efforts. He has agreed to the Category 3 Plan with breakfast options.   Start Metformin for incretin effect and diabetes mellitus prevention.  Exercise goals:  As is  Behavioral modification strategies: increasing lean protein intake, decreasing simple carbohydrates, increasing water intake, meal planning and cooking strategies, better snacking choices, avoiding temptations, and planning for success.  Devin Osborne has agreed to follow-up with our clinic in 2-3 weeks. He was informed of the importance of frequent follow-up visits to maximize his success with intensive lifestyle modifications for his multiple health conditions.   Objective:   Blood pressure 114/86, pulse 70, temperature 97.8 F (36.6 C), height 6\' 1"  (1.854 m), weight 282 lb (127.9 kg), SpO2 98 %. Body mass index is 37.21 kg/m.  General: Cooperative, alert, well developed, in no acute distress. HEENT: Conjunctivae and lids unremarkable. Cardiovascular: Regular rhythm.  Lungs: Normal work of breathing. Neurologic: No focal deficits.  Lab Results  Component Value Date   CREATININE 1.07 12/12/2020   BUN 11 12/12/2020   NA 141 12/12/2020   K 4.1 12/12/2020   CL 102 12/12/2020   CO2 28 12/12/2020   Lab Results   Component Value Date   ALT 27 12/12/2020   AST 23 12/12/2020   ALKPHOS 93 12/12/2020   BILITOT 0.7 12/12/2020   Lab Results  Component Value Date   HGBA1C 6.0 (H) 10/01/2022   Lab Results  Component Value Date   INSULIN 16.7 10/01/2022   Lab Results  Component Value Date   TSH 2.010 10/01/2022   Lab Results  Component Value Date   CHOL 149 10/01/2022   HDL 33 (L) 10/01/2022   LDLCALC 93 10/01/2022   TRIG 124 10/01/2022   CHOLHDL 6.0 (H) 05/15/2020   Lab Results  Component Value Date   VD25OH 23.6 (L) 10/01/2022   Lab Results  Component Value Date   WBC 9.3 05/15/2020   HGB 15.3 05/15/2020   HCT 43.8 05/15/2020   MCV 84.7 05/15/2020   PLT 241 05/15/2020    Attestation Statements:   Reviewed by clinician on day of visit: allergies, medications, problem list, medical history, surgical history, family history, social history, and previous encounter notes.  Time spent on visit including pre-visit chart review and post-visit care and charting was 40 minutes.   I, Kyung Rudd, BS, CMA, am acting as transcriptionist for Worthy Rancher, MD.  I have reviewed the above documentation for accuracy and completeness, and I agree with the above. -Worthy Rancher, MD

## 2022-11-06 ENCOUNTER — Other Ambulatory Visit: Payer: Self-pay | Admitting: Allergy

## 2022-11-10 ENCOUNTER — Ambulatory Visit (INDEPENDENT_AMBULATORY_CARE_PROVIDER_SITE_OTHER): Payer: No Typology Code available for payment source | Admitting: Internal Medicine

## 2022-11-10 ENCOUNTER — Other Ambulatory Visit (HOSPITAL_COMMUNITY): Payer: Self-pay

## 2022-11-10 ENCOUNTER — Encounter (INDEPENDENT_AMBULATORY_CARE_PROVIDER_SITE_OTHER): Payer: Self-pay | Admitting: Internal Medicine

## 2022-11-10 VITALS — BP 136/82 | HR 70 | Temp 98.2°F | Ht 73.0 in | Wt 275.0 lb

## 2022-11-10 DIAGNOSIS — Z6836 Body mass index (BMI) 36.0-36.9, adult: Secondary | ICD-10-CM

## 2022-11-10 DIAGNOSIS — E669 Obesity, unspecified: Secondary | ICD-10-CM

## 2022-11-10 DIAGNOSIS — E559 Vitamin D deficiency, unspecified: Secondary | ICD-10-CM | POA: Diagnosis not present

## 2022-11-10 DIAGNOSIS — I10 Essential (primary) hypertension: Secondary | ICD-10-CM

## 2022-11-10 DIAGNOSIS — E7849 Other hyperlipidemia: Secondary | ICD-10-CM | POA: Diagnosis not present

## 2022-11-10 DIAGNOSIS — R7303 Prediabetes: Secondary | ICD-10-CM | POA: Diagnosis not present

## 2022-11-10 MED ORDER — METFORMIN HCL ER 500 MG PO TB24
500.0000 mg | ORAL_TABLET | Freq: Every day | ORAL | 0 refills | Status: DC
  Filled 2022-11-10 – 2022-11-16 (×2): qty 30, 30d supply, fill #0

## 2022-11-10 NOTE — Progress Notes (Signed)
Chief Complaint:   OBESITY Devin Osborne is here to discuss his progress with his obesity treatment plan along with follow-up of his obesity related diagnoses. Devin Osborne is on the Category 3 Plan and states he is following his eating plan approximately 90% of the time. Devin Osborne states he is playing basketball for 2 hours 1 time per week, and walking 2 miles 5 times per week.    Today's visit was #: 3 Starting weight: 284 lbs Starting date: 10/01/2022 Today's weight: 275 lbs Today's date: 11/10/2022 Total lbs lost to date: 9 Total lbs lost since last in-office visit: 7  Interim History: Devin Osborne presents today for follow-up.  He notes that metformin has helped him with sense of fullness and decreased cravings.  For breakfast he has been eating [place] yogurt with some protein granola.  Some days he eats eggs.  He is no longer skipping meals.  He is also eating salads and lean sources of protein at [employer].  He is also making healthier choices and watching portion sizes.  He has noticed decreased snacking and adequate satiety and satiation.  He denies any abnormal cravings.  He denies any side effects to metformin.  Subjective:   1. Vitamin D deficiency He is currently taking prescription vitamin D 50,000 IU each week. He denies nausea, vomiting or muscle weakness.  2. Essential hypertension Devin Osborne blood pressure has improved. He had been on blood pressure medications in the past, and stopped due to hypotension. He denies cardiovascular risk or complications.   3. Other hyperlipidemia Devin Osborne last LDL was 93 on rosuvastatin. Muscles have improved since starting Vitamin D.   4. Prediabetes Devin Osborne has a diagnosis of prediabetes based on his elevated HgA1c and was informed this puts him at greater risk of developing diabetes. He is on metforminXR and he continues to work on diet and exercise to decrease his risk of diabetes. He denies nausea or hypoglycemia.  Assessment/Plan:    1. Vitamin D deficiency Low Vitamin D level contributes to fatigue and are associated with obesity, breast, and colon cancer. He agrees to continue to take prescription Vitamin D 50,000 IU every week and will follow-up for routine testing of Vitamin D, at least 2-3 times per year to avoid over-replacement.  2. Essential hypertension Devin Osborne will continue with his weight loss therapy and monitor at home twice daily. Start pharmacotherapy if blood pressure is >120/80.   3. Other hyperlipidemia Devin Osborne will continue statin and Vitamin D supplementation.   4. Prediabetes Devin Osborne will continue with his weight loss therapy, and we will refill metformin for 1 month.   - metFORMIN (GLUCOPHAGE-XR) 500 MG 24 hr tablet; Take 1 tablet (500 mg total) by mouth daily with breakfast.  Dispense: 30 tablet; Refill: 0  5. Obesity, current BMI 36.4 Devin Osborne is currently in the action stage of change. As such, his goal is to continue with weight loss efforts. He has agreed to the Category 3 Plan.   Exercise goals: Increase duration of exercise.   Behavioral modification strategies: increasing lean protein intake, decreasing simple carbohydrates, increasing water intake, no skipping meals, meal planning and cooking strategies, holiday eating strategies , avoiding temptations, and planning for success.  Devin Osborne has agreed to follow-up with our clinic in 3 to 4 weeks. He was informed of the importance of frequent follow-up visits to maximize his success with intensive lifestyle modifications for his multiple health conditions.   Objective:   Blood pressure 136/82, pulse 70, temperature 98.2 F (36.8 C), height 6'  1" (1.854 m), weight 275 lb (124.7 kg), SpO2 98 %. Body mass index is 36.28 kg/m.  General: Cooperative, alert, well developed, in no acute distress. HEENT: Conjunctivae and lids unremarkable. Cardiovascular: Regular rhythm.  Lungs: Normal work of breathing. Neurologic: No focal deficits.    Lab Results  Component Value Date   CREATININE 1.07 12/12/2020   BUN 11 12/12/2020   NA 141 12/12/2020   K 4.1 12/12/2020   CL 102 12/12/2020   CO2 28 12/12/2020   Lab Results  Component Value Date   ALT 27 12/12/2020   AST 23 12/12/2020   ALKPHOS 93 12/12/2020   BILITOT 0.7 12/12/2020   Lab Results  Component Value Date   HGBA1C 6.0 (H) 10/01/2022   Lab Results  Component Value Date   INSULIN 16.7 10/01/2022   Lab Results  Component Value Date   TSH 2.010 10/01/2022   Lab Results  Component Value Date   CHOL 149 10/01/2022   HDL 33 (L) 10/01/2022   LDLCALC 93 10/01/2022   TRIG 124 10/01/2022   CHOLHDL 6.0 (H) 05/15/2020   Lab Results  Component Value Date   VD25OH 23.6 (L) 10/01/2022   Lab Results  Component Value Date   WBC 9.3 05/15/2020   HGB 15.3 05/15/2020   HCT 43.8 05/15/2020   MCV 84.7 05/15/2020   PLT 241 05/15/2020   No results found for: "IRON", "TIBC", "FERRITIN"  Attestation Statements:   Reviewed by clinician on day of visit: allergies, medications, problem list, medical history, surgical history, family history, social history, and previous encounter notes.  Time spent on visit including pre-visit chart review and post-visit care and charting was 20 minutes.   Trude Mcburney, am acting as transcriptionist for Worthy Rancher, MD.  I have reviewed the above documentation for accuracy and completeness, and I agree with the above. -Worthy Rancher, MD

## 2022-11-17 ENCOUNTER — Other Ambulatory Visit (HOSPITAL_COMMUNITY): Payer: Self-pay

## 2022-11-17 ENCOUNTER — Other Ambulatory Visit: Payer: Self-pay

## 2022-12-03 ENCOUNTER — Encounter (INDEPENDENT_AMBULATORY_CARE_PROVIDER_SITE_OTHER): Payer: Self-pay | Admitting: Internal Medicine

## 2022-12-03 ENCOUNTER — Ambulatory Visit (INDEPENDENT_AMBULATORY_CARE_PROVIDER_SITE_OTHER): Payer: 59 | Admitting: Internal Medicine

## 2022-12-03 ENCOUNTER — Other Ambulatory Visit (HOSPITAL_COMMUNITY): Payer: Self-pay

## 2022-12-03 VITALS — BP 131/87 | HR 73 | Temp 98.2°F | Ht 73.0 in | Wt 275.0 lb

## 2022-12-03 DIAGNOSIS — Z6836 Body mass index (BMI) 36.0-36.9, adult: Secondary | ICD-10-CM | POA: Diagnosis not present

## 2022-12-03 DIAGNOSIS — E669 Obesity, unspecified: Secondary | ICD-10-CM | POA: Diagnosis not present

## 2022-12-03 DIAGNOSIS — R638 Other symptoms and signs concerning food and fluid intake: Secondary | ICD-10-CM | POA: Diagnosis not present

## 2022-12-03 DIAGNOSIS — Z6837 Body mass index (BMI) 37.0-37.9, adult: Secondary | ICD-10-CM

## 2022-12-03 DIAGNOSIS — E559 Vitamin D deficiency, unspecified: Secondary | ICD-10-CM | POA: Diagnosis not present

## 2022-12-03 MED ORDER — SEMAGLUTIDE-WEIGHT MANAGEMENT 0.25 MG/0.5ML ~~LOC~~ SOAJ
0.2500 mg | SUBCUTANEOUS | 0 refills | Status: AC
  Filled 2022-12-03 – 2022-12-19 (×2): qty 2, 28d supply, fill #0

## 2022-12-03 NOTE — Progress Notes (Signed)
Chief Complaint:   OBESITY Devin Osborne is here to discuss his progress with his obesity treatment plan along with follow-up of his obesity related diagnoses. Devin Osborne is on the Category 3 Plan and states he is following his eating plan approximately 90% of the time. Devin Osborne states he is playing basketball for 2-3 hours 1-2 times per week, and walking 10,000 steps 6 times per week.    Today's visit was #: 4 Starting weight: 284 lbs Starting date: 10/01/2022 Today's weight: 275 lbs Today's date: 12/03/2022 Total lbs lost to date: 9 Total lbs lost since last in-office visit: 0  Interim History: Devin Osborne presents today for follow-up.  Since last office visit he is weight neutral.  He reports good adherence to prescribed reduced calorie meal plan but is having difficulties with hunger and cravings.  We had started him on metformin which initially had helped.  He feels he is no longer responding to medications.  He reports having a strong urge to eat and having a lot of temptation affecting control of portions at dinner.  He is physically active.  Subjective:   1. Vitamin D deficiency He is currently taking prescription vitamin D 50,000 IU each week. He denies nausea, vomiting or muscle weakness.  2. Abnormal cravings Multifactorial, and neurohormonal.   Assessment/Plan:   1. Vitamin D deficiency Low Vitamin D level contributes to fatigue and are associated with obesity, breast, and colon cancer. He agrees to continue to take prescription Vitamin D 50,000 IU every week and will follow-up for routine testing of Vitamin D, at least 2-3 times per year to avoid over-replacement.  2. Abnormal cravings Behavioral modifications were discussed. Start GLP-1 agent.   3. Obesity, current BMI 36.3 In addition to prescribed reduced calorie nutrition plan patient would benefit from a GLP-1 drug to assist with hunger signals, cravings and satiety.  After discussion of benefits, common side effects, risks,  indications for long-term use and shared decision making patient is agreeable to starting Wegovy 0.25 mg once a week.  We demonstrated use of pen device.  - Semaglutide-Weight Management 0.25 MG/0.5ML SOAJ; Inject 0.25 mg into the skin once a week for 28 days.  Dispense: 2 mL; Refill: 0  Devin Osborne is currently in the action stage of change. As such, his goal is to continue with weight loss efforts. He has agreed to the Category 3 Plan.   Exercise goals: As is.   Behavioral modification strategies: increasing lean protein intake, increasing water intake, meal planning and cooking strategies, avoiding temptations, and planning for success.  Devin Osborne has agreed to follow-up with our clinic in 3 weeks. He was informed of the importance of frequent follow-up visits to maximize his success with intensive lifestyle modifications for his multiple health conditions.   Objective:   Blood pressure 131/87, pulse 73, temperature 98.2 F (36.8 C), height 6\' 1"  (1.854 m), weight 275 lb (124.7 kg), SpO2 98 %. Body mass index is 36.28 kg/m.  General: Cooperative, alert, well developed, in no acute distress. HEENT: Conjunctivae and lids unremarkable. Cardiovascular: Regular rhythm.  Lungs: Normal work of breathing. Neurologic: No focal deficits.   Lab Results  Component Value Date   CREATININE 1.07 12/12/2020   BUN 11 12/12/2020   NA 141 12/12/2020   K 4.1 12/12/2020   CL 102 12/12/2020   CO2 28 12/12/2020   Lab Results  Component Value Date   ALT 27 12/12/2020   AST 23 12/12/2020   ALKPHOS 93 12/12/2020   BILITOT 0.7 12/12/2020  Lab Results  Component Value Date   HGBA1C 6.0 (H) 10/01/2022   Lab Results  Component Value Date   INSULIN 16.7 10/01/2022   Lab Results  Component Value Date   TSH 2.010 10/01/2022   Lab Results  Component Value Date   CHOL 149 10/01/2022   HDL 33 (L) 10/01/2022   LDLCALC 93 10/01/2022   TRIG 124 10/01/2022   CHOLHDL 6.0 (H) 05/15/2020   Lab  Results  Component Value Date   VD25OH 23.6 (L) 10/01/2022   Lab Results  Component Value Date   WBC 9.3 05/15/2020   HGB 15.3 05/15/2020   HCT 43.8 05/15/2020   MCV 84.7 05/15/2020   PLT 241 05/15/2020   No results found for: "IRON", "TIBC", "FERRITIN"  Attestation Statements:   Reviewed by clinician on day of visit: allergies, medications, problem list, medical history, surgical history, family history, social history, and previous encounter notes.   Wilhemena Durie, am acting as transcriptionist for Thomes Dinning, MD.  I have reviewed the above documentation for accuracy and completeness, and I agree with the above. -Thomes Dinning, MD

## 2022-12-04 ENCOUNTER — Other Ambulatory Visit (HOSPITAL_COMMUNITY): Payer: Self-pay

## 2022-12-11 ENCOUNTER — Other Ambulatory Visit (HOSPITAL_COMMUNITY): Payer: Self-pay

## 2022-12-11 ENCOUNTER — Encounter (INDEPENDENT_AMBULATORY_CARE_PROVIDER_SITE_OTHER): Payer: Self-pay | Admitting: Internal Medicine

## 2022-12-16 ENCOUNTER — Other Ambulatory Visit (HOSPITAL_COMMUNITY): Payer: Self-pay

## 2022-12-18 ENCOUNTER — Other Ambulatory Visit (HOSPITAL_COMMUNITY): Payer: Self-pay

## 2022-12-18 ENCOUNTER — Telehealth (INDEPENDENT_AMBULATORY_CARE_PROVIDER_SITE_OTHER): Payer: Self-pay

## 2022-12-18 NOTE — Telephone Encounter (Signed)
PA done

## 2022-12-19 ENCOUNTER — Other Ambulatory Visit (HOSPITAL_COMMUNITY): Payer: Self-pay

## 2022-12-22 NOTE — Telephone Encounter (Signed)
PA denied.

## 2022-12-25 ENCOUNTER — Encounter (INDEPENDENT_AMBULATORY_CARE_PROVIDER_SITE_OTHER): Payer: Self-pay | Admitting: Family Medicine

## 2022-12-25 ENCOUNTER — Other Ambulatory Visit (HOSPITAL_COMMUNITY): Payer: Self-pay

## 2022-12-25 ENCOUNTER — Telehealth (INDEPENDENT_AMBULATORY_CARE_PROVIDER_SITE_OTHER): Payer: 59 | Admitting: Family Medicine

## 2022-12-25 DIAGNOSIS — E669 Obesity, unspecified: Secondary | ICD-10-CM | POA: Diagnosis not present

## 2022-12-25 DIAGNOSIS — Z6836 Body mass index (BMI) 36.0-36.9, adult: Secondary | ICD-10-CM

## 2022-12-25 DIAGNOSIS — R7303 Prediabetes: Secondary | ICD-10-CM | POA: Diagnosis not present

## 2022-12-25 DIAGNOSIS — E559 Vitamin D deficiency, unspecified: Secondary | ICD-10-CM

## 2022-12-25 MED ORDER — ERGOCALCIFEROL 1.25 MG (50000 UT) PO CAPS
50000.0000 [IU] | ORAL_CAPSULE | ORAL | 0 refills | Status: DC
  Filled 2022-12-25: qty 12, 84d supply, fill #0

## 2022-12-25 MED ORDER — METFORMIN HCL ER 500 MG PO TB24
500.0000 mg | ORAL_TABLET | Freq: Every day | ORAL | 0 refills | Status: DC
  Filled 2022-12-25: qty 30, 30d supply, fill #0

## 2022-12-25 NOTE — Progress Notes (Signed)
TeleHealth Visit:  This visit was completed with telemedicine (audio/video) technology. Devin Osborne has verbally consented to this TeleHealth visit. The patient is located at home, the provider is located at home. The participants in this visit include the listed provider and patient. The visit was conducted today via MyChart video.  OBESITY Devin Osborne is here to discuss his progress with his obesity treatment plan along with follow-up of his obesity related diagnoses.   Today's visit was # 5 Starting weight: 284 lbs Starting date: 10/01/2022 Weight at last in office visit: 275 lbs on 12/03/22 Total weight loss: 9 lbs at last in office visit on 12/03/22. Today's reported weight: 270 lbs   Nutrition Plan: the Category 3 Plan. - 90%  Current exercise:  playing basketball for 2-3 hours 1 times per week, and walking 8000-10,000 steps 6 days per week.    Interim History:  Devin Osborne had to switch to a virtual visit because he has a mild case of COVID. He has done well with the category 3 plan with 90% adherence.  He reports a weight of 270 pounds today reflecting some weight loss since his last in office visit. He is eating all of the food on the plan.  He generally eats lunch in the cafeteria at work and chooses a salad with chicken breast.  Has the vinaigrette dressing.   He reports getting hungry quickly after meals.  He also has some sweets cravings. Denies regular intake of sugar sweetened beverages.  Drinks water and sometimes a 0-calorie soda.  Wegovy 0.25 mg prescribed last office visit but he has been unable to get it because of availability.  It was sent to [place] [facility].  He will be changing jobs from [employer] to [place] (in [place]) in March and has concerns about being able to continue his visits.  Advised that he will be able to alternate virtual and in person visits if this helps. Encouraged him to evaluate the health plans available there and choose one  that covers antiobesity obesity medications  if possible.  Assessment/Plan:  1. Vitamin D Deficiency Vitamin D low at 23 on 10/01/2022. He is on weekly prescription Vitamin D 50,000 IU.  Lab Results  Component Value Date   VD25OH 23.6 (L) 10/01/2022    Plan: Refill prescription vitamin D 50,000 IU weekly.   2. Prediabetes  Last A1c elevated at 6.0 on 10/01/2022.  No family history of diabetes. Prescribed metformin XR 500 mg daily but has not taken it the past few days because he has been sick.  Tolerating the metformin well despite having IBS. Lab Results  Component Value Date   HGBA1C 6.0 (H) 10/01/2022   Lab Results  Component Value Date   INSULIN 16.7 10/01/2022    Plan: Refill metformin XR 500 mg daily with breakfast   3. Obesity: Current BMI 36 Sent information via MyChart about [place] [facility] [employer] order.  Advised he can call them to see if they have Wegovy 0.25 mg available.  Devin Osborne is currently in the action stage of change. As such, his goal is to continue with weight loss efforts.  He has agreed to the Category 3 Plan.   Exercise goals:  as is  Behavioral modification strategies: increasing lean protein intake, decreasing simple carbohydrates, meal planning and cooking strategies, and planning for success.  Devin Osborne has agreed to follow-up with our clinic in 3 weeks.   No orders of the defined types were placed in this encounter.  Medications Discontinued During This Encounter  Medication Reason   ergocalciferol (VITAMIN D2) 1.25 MG (50000 UT) capsule Reorder   metFORMIN (GLUCOPHAGE-XR) 500 MG 24 hr tablet Reorder     Meds ordered this encounter  Medications   ergocalciferol (VITAMIN D2) 1.25 MG (50000 UT) capsule    Sig: Take 1 capsule (50,000 Units total) by mouth once a week.    Dispense:  12 capsule    Refill:  0    Order Specific Question:   Supervising Provider    Answer:   Dell Ponto [[id number]]   metFORMIN (GLUCOPHAGE-XR)  500 MG 24 hr tablet    Sig: Take 1 tablet (500 mg total) by mouth daily with breakfast.    Dispense:  30 tablet    Refill:  0    Order Specific Question:   Supervising Provider    Answer:   Dell Ponto [[id number]]      Objective:   VITALS: Per patient if applicable, see vitals. GENERAL: Alert and in no acute distress. CARDIOPULMONARY: No increased WOB. Speaking in clear sentences.  PSYCH: Pleasant and cooperative. Speech normal rate and rhythm. Affect is appropriate. Insight and judgement are appropriate. Attention is focused, linear, and appropriate.  NEURO: Oriented as arrived to appointment on time with no prompting.   Lab Results  Component Value Date   CREATININE 1.07 12/12/2020   BUN 11 12/12/2020   NA 141 12/12/2020   K 4.1 12/12/2020   CL 102 12/12/2020   CO2 28 12/12/2020   Lab Results  Component Value Date   ALT 27 12/12/2020   AST 23 12/12/2020   ALKPHOS 93 12/12/2020   BILITOT 0.7 12/12/2020   Lab Results  Component Value Date   HGBA1C 6.0 (H) 10/01/2022   Lab Results  Component Value Date   INSULIN 16.7 10/01/2022   Lab Results  Component Value Date   TSH 2.010 10/01/2022   Lab Results  Component Value Date   CHOL 149 10/01/2022   HDL 33 (L) 10/01/2022   LDLCALC 93 10/01/2022   TRIG 124 10/01/2022   CHOLHDL 6.0 (H) 05/15/2020   Lab Results  Component Value Date   WBC 9.3 05/15/2020   HGB 15.3 05/15/2020   HCT 43.8 05/15/2020   MCV 84.7 05/15/2020   PLT 241 05/15/2020   No results found for: "IRON", "TIBC", "FERRITIN" Lab Results  Component Value Date   VD25OH 23.6 (L) 10/01/2022    Attestation Statements:   Reviewed by clinician on day of visit: allergies, medications, problem list, medical history, surgical history, family history, social history, and previous encounter notes.

## 2022-12-25 NOTE — Telephone Encounter (Signed)
PA is approved 

## 2022-12-26 ENCOUNTER — Other Ambulatory Visit (HOSPITAL_COMMUNITY): Payer: Self-pay

## 2022-12-30 ENCOUNTER — Other Ambulatory Visit (HOSPITAL_COMMUNITY): Payer: Self-pay

## 2023-01-19 ENCOUNTER — Other Ambulatory Visit (HOSPITAL_COMMUNITY): Payer: Self-pay

## 2023-01-19 ENCOUNTER — Ambulatory Visit (INDEPENDENT_AMBULATORY_CARE_PROVIDER_SITE_OTHER): Payer: 59 | Admitting: Internal Medicine

## 2023-01-19 ENCOUNTER — Encounter (INDEPENDENT_AMBULATORY_CARE_PROVIDER_SITE_OTHER): Payer: Self-pay | Admitting: Internal Medicine

## 2023-01-19 VITALS — BP 134/82 | HR 71 | Temp 98.0°F | Ht 73.0 in | Wt 273.0 lb

## 2023-01-19 DIAGNOSIS — E559 Vitamin D deficiency, unspecified: Secondary | ICD-10-CM | POA: Diagnosis not present

## 2023-01-19 DIAGNOSIS — E669 Obesity, unspecified: Secondary | ICD-10-CM | POA: Diagnosis not present

## 2023-01-19 DIAGNOSIS — Z6837 Body mass index (BMI) 37.0-37.9, adult: Secondary | ICD-10-CM | POA: Diagnosis not present

## 2023-01-19 DIAGNOSIS — R7303 Prediabetes: Secondary | ICD-10-CM | POA: Diagnosis not present

## 2023-01-19 MED ORDER — SEMAGLUTIDE-WEIGHT MANAGEMENT 0.5 MG/0.5ML ~~LOC~~ SOAJ
0.5000 mg | SUBCUTANEOUS | 0 refills | Status: DC
  Filled 2023-01-19: qty 2, 28d supply, fill #0

## 2023-01-19 NOTE — Progress Notes (Signed)
Eating healthy and balance  Office: [phone]  /  Fax: [phone]  WEIGHT SUMMARY AND BIOMETRICS  Medical Weight Loss Height: 6' 1"$  (1.854 m) Weight: 273 lb (123.8 kg) Temp: 98 F (36.7 C) Pulse Rate: 71 BP: 134/82 SpO2: 97 % Fasting: No Labs: No Today's Visit #: 6 Weight at Last VIsit: 275 lb Weight Lost Since Last Visit: 2 lb  Body Fat %: 29.7 % Fat Mass (lbs): 81 lbs Muscle Mass (lbs): 182 lbs Total Body Water (lbs): 132.4 lbs Visceral Fat Rating : 15 Peak Weight: 305 lb Starting Date: 10/01/22 Starting Weight: 284 lb Total Weight Loss (lbs): 11 lb (4.99 kg)    HPI  Chief Complaint: OBESITY  Devin Osborne is here to discuss his progress with his obesity treatment plan. He is on the the Category 2 Plan and states he is following his eating plan approximately 85 % of the time. He states he is exercising 90 minutes 3 times per week.   Interval History:  Since last office visit he has been working on eating healthy and balanced and reports good. adherence to prescribed reduced calorie nutrition plan.  He recently started Devin Osborne at 0.25 mg once a week and did the first injection a few days ago.  He denies any adverse effects.  Has been working on eating healthy and balance but consistency is sometimes an issue. Denies problems with appetite and hunger signals.  Denies problems with satiety and satiation.  Denies problems with eating patterns and portion control.  Sleeping approximately 7 hours a day.  Sleep described as non-restorative.  Stress levels are reported as low and manageable.  Barriers identified none and other:Has problems with boredom and repetitiveness  .    Pharmacotherapy: Wegovy 0.25 mg once a week  PHYSICAL EXAM:  Blood pressure 134/82, pulse 71, temperature 98 F (36.7 C), height 6' 1"$  (1.854 m), weight 273 lb (123.8 kg), SpO2 97 %. Body mass index is 36.02 kg/m.  General: He is overweight, cooperative, alert, well developed, and in no acute  distress. PSYCH: Has normal mood, affect and thought process.   HEENT: EOMI, sclerae are anicteric. Lungs: Normal breathing effort, no conversational dyspnea. Extremities: No edema.  Neurologic: No gross sensory or motor deficits. No tremors or fasciculations noted.    ASSESSMENT AND PLAN  TREATMENT PLAN FOR OBESITY:  Recommended Dietary Goals  Devin Osborne is currently in the action stage of change. As such, his goal is to continue weight management plan. He has agreed to the Category 2 Plan.  Behavioral Intervention  We discussed the following Behavioral Modification Strategies today: increasing lean protein intake, increasing vegetables, and increase water intake.  Additional resources provided today: NA  Recommended Physical Activity Goals  Devin Osborne has been advised to work up to 150 minutes of moderate intensity aerobic activity a week and strengthening exercises 2-3 times per week for cardiovascular health, weight loss maintenance and preservation of muscle mass.   He has agreed to continue physical activity as is.    Pharmacotherapy We discussed various medication options to help Devin Osborne with his weight loss efforts and we both agreed to increase Wegovy to .5 mg once a week for 4 weeks.  ASSOCIATED CONDITIONS ADDRESSED TODAY  Prediabetes Assessment & Plan: Most recent A1c is  Lab Results  Component Value Date   HGBA1C 6.0 (H) 10/01/2022   with associated elevated insulin levels.  Patient informed of disease state and risk of progression. This may contribute to abnormal cravings, fatigue and diabetes complications without having diabetes.  Continue with the reduced calorie nutrition plan, avoidance of simple and added sugars.  He is currently on incretin therapy with Wegovy which will help    Obesity: Beginning BMI 37.7  Vitamin D deficiency Assessment & Plan: Most recent vitamin D levels  Lab Results  Component Value Date   VD25OH 23.6 (L) 10/01/2022      Deficiency state associated with adiposity and may result in leptin resistance, weight gain and fatigue. Currently on vitamin D supplementation without any adverse effects.  Plan: Continue high-dose vitamin D.  We will check levels at the next office visit.    Other orders -     Semaglutide-Weight Management; Inject 0.5 mg into the skin once a week for 28 days.  Dispense: 2 mL; Refill: 0      DIAGNOSTIC DATA REVIEWED:  BMET    Component Value Date/Time   NA 141 12/12/2020 0806   K 4.1 12/12/2020 0806   CL 102 12/12/2020 0806   CO2 28 12/12/2020 0806   GLUCOSE 101 (H) 12/12/2020 0806   GLUCOSE 84 05/15/2020 1555   BUN 11 12/12/2020 0806   CREATININE 1.07 12/12/2020 0806   CREATININE 0.89 05/15/2020 1555   CALCIUM 9.4 12/12/2020 0806   GFRNONAA 88 12/12/2020 0806   GFRNONAA 109 05/15/2020 1555   GFRAA 101 12/12/2020 0806   GFRAA 127 05/15/2020 1555   Lab Results  Component Value Date   HGBA1C 6.0 (H) 10/01/2022   Lab Results  Component Value Date   INSULIN 16.7 10/01/2022   Lab Results  Component Value Date   TSH 2.010 10/01/2022   CBC    Component Value Date/Time   WBC 9.3 05/15/2020 1555   RBC 5.17 05/15/2020 1555   HGB 15.3 05/15/2020 1555   HCT 43.8 05/15/2020 1555   PLT 241 05/15/2020 1555   MCV 84.7 05/15/2020 1555   MCH 29.6 05/15/2020 1555   MCHC 34.9 05/15/2020 1555   RDW 12.4 05/15/2020 1555   Iron Studies No results found for: "IRON", "TIBC", "FERRITIN", "IRONPCTSAT" Lipid Panel     Component Value Date/Time   CHOL 149 10/01/2022 1005   TRIG 124 10/01/2022 1005   HDL 33 (L) 10/01/2022 1005   CHOLHDL 6.0 (H) 05/15/2020 1555   LDLCALC 93 10/01/2022 1005   LDLCALC 149 (H) 05/15/2020 1555   Hepatic Function Panel     Component Value Date/Time   PROT 7.2 12/12/2020 0806   ALBUMIN 4.8 12/12/2020 0806   AST 23 12/12/2020 0806   ALT 27 12/12/2020 0806   ALKPHOS 93 12/12/2020 0806   BILITOT 0.7 12/12/2020 0806      Component Value  Date/Time   TSH 2.010 10/01/2022 1005   Nutritional Lab Results  Component Value Date   VD25OH 23.6 (L) 10/01/2022      Return in about 3 weeks (around 02/09/2023) for For Weight Mangement with Dr. Gerarda Osborne.Marland Kitchen He was informed of the importance of frequent follow up visits to maximize his success with intensive lifestyle modifications for his multiple health conditions.    ATTESTASTION STATEMENTS:  Reviewed by clinician on day of visit: allergies, medications, problem list, medical history, surgical history, family history, social history, and previous encounter notes.      Thomes Dinning, MD

## 2023-01-19 NOTE — Assessment & Plan Note (Signed)
Most recent vitamin D levels  Lab Results  Component Value Date   VD25OH 23.6 (L) 10/01/2022     Deficiency state associated with adiposity and may result in leptin resistance, weight gain and fatigue. Currently on vitamin D supplementation without any adverse effects.  Plan: Continue high-dose vitamin D.  We will check levels at the next office visit.

## 2023-01-19 NOTE — Assessment & Plan Note (Signed)
Most recent A1c is  Lab Results  Component Value Date   HGBA1C 6.0 (H) 10/01/2022   with associated elevated insulin levels.  Patient informed of disease state and risk of progression. This may contribute to abnormal cravings, fatigue and diabetes complications without having diabetes.   Continue with the reduced calorie nutrition plan, avoidance of simple and added sugars.  He is currently on incretin therapy with [facility] which will help

## 2023-01-20 ENCOUNTER — Other Ambulatory Visit (HOSPITAL_COMMUNITY): Payer: Self-pay

## 2023-01-22 DIAGNOSIS — Z3009 Encounter for other general counseling and advice on contraception: Secondary | ICD-10-CM | POA: Diagnosis not present

## 2023-01-29 ENCOUNTER — Other Ambulatory Visit: Payer: Self-pay | Admitting: Physician Assistant

## 2023-01-29 DIAGNOSIS — Z0000 Encounter for general adult medical examination without abnormal findings: Secondary | ICD-10-CM | POA: Diagnosis not present

## 2023-01-29 DIAGNOSIS — Z125 Encounter for screening for malignant neoplasm of prostate: Secondary | ICD-10-CM | POA: Diagnosis not present

## 2023-01-29 DIAGNOSIS — Z23 Encounter for immunization: Secondary | ICD-10-CM | POA: Diagnosis not present

## 2023-01-29 DIAGNOSIS — E669 Obesity, unspecified: Secondary | ICD-10-CM | POA: Diagnosis not present

## 2023-01-30 ENCOUNTER — Other Ambulatory Visit (HOSPITAL_BASED_OUTPATIENT_CLINIC_OR_DEPARTMENT_OTHER): Payer: Self-pay

## 2023-02-09 ENCOUNTER — Ambulatory Visit (INDEPENDENT_AMBULATORY_CARE_PROVIDER_SITE_OTHER): Payer: 59 | Admitting: Internal Medicine

## 2023-02-09 ENCOUNTER — Other Ambulatory Visit (HOSPITAL_COMMUNITY): Payer: Self-pay

## 2023-02-09 ENCOUNTER — Encounter (INDEPENDENT_AMBULATORY_CARE_PROVIDER_SITE_OTHER): Payer: Self-pay | Admitting: Internal Medicine

## 2023-02-09 VITALS — BP 135/86 | HR 72 | Temp 98.3°F | Ht 73.0 in | Wt 273.0 lb

## 2023-02-09 DIAGNOSIS — E559 Vitamin D deficiency, unspecified: Secondary | ICD-10-CM

## 2023-02-09 DIAGNOSIS — Z6836 Body mass index (BMI) 36.0-36.9, adult: Secondary | ICD-10-CM | POA: Diagnosis not present

## 2023-02-09 DIAGNOSIS — I10 Essential (primary) hypertension: Secondary | ICD-10-CM | POA: Diagnosis not present

## 2023-02-09 DIAGNOSIS — R7303 Prediabetes: Secondary | ICD-10-CM | POA: Diagnosis not present

## 2023-02-09 DIAGNOSIS — K5903 Drug induced constipation: Secondary | ICD-10-CM

## 2023-02-09 DIAGNOSIS — E669 Obesity, unspecified: Secondary | ICD-10-CM

## 2023-02-09 MED ORDER — ERGOCALCIFEROL 1.25 MG (50000 UT) PO CAPS
50000.0000 [IU] | ORAL_CAPSULE | ORAL | 0 refills | Status: AC
  Filled 2023-02-09 – 2023-02-12 (×3): qty 12, 84d supply, fill #0

## 2023-02-09 MED ORDER — POLYETHYLENE GLYCOL 3350 17 GM/SCOOP PO POWD: 17.0000 g | Freq: Two times a day (BID) | ORAL | 1 refills | Status: AC | PRN

## 2023-02-09 MED ORDER — SEMAGLUTIDE-WEIGHT MANAGEMENT 0.5 MG/0.5ML ~~LOC~~ SOAJ
0.5000 mg | SUBCUTANEOUS | 0 refills | Status: DC
  Filled 2023-02-09 – 2023-02-12 (×2): qty 2, 28d supply, fill #0

## 2023-02-09 NOTE — Assessment & Plan Note (Signed)
Most recent A1c is  Lab Results  Component Value Date   HGBA1C 6.0 (H) 10/01/2022   with associated elevated insulin levels.  Patient informed of disease state and risk of progression. This may contribute to abnormal cravings, fatigue and diabetes complications without having diabetes.   We will increase Wegovy 2.5 mg once a week.  Continue with reduced calorie nutrition plan.

## 2023-02-09 NOTE — Assessment & Plan Note (Signed)
Medication induced likely secondary to GLP-1.  He also takes anticholinergics.  We counseled on increasing water to about 100 ounces a day and fiber in his diet.  He may also try MiraLAX 1-2 servings a day.  He could also do Dulcolax once daily.  Patient is also considering having screening colonoscopy he is 42 years old and is part of a high risk ethnic group.

## 2023-02-09 NOTE — Progress Notes (Signed)
Office: [phone]  /  Fax: [phone]  WEIGHT SUMMARY AND BIOMETRICS  Vitals Temp: 98.3 F (36.8 C) BP: 135/86 Pulse Rate: 72 SpO2: 98 %   Anthropometric Measurements Height: '6\' 1"'$  (1.854 m) Weight: 273 lb (123.8 kg) BMI (Calculated): 36.03 Weight at Last Visit: 273 lb Weight Lost Since Last Visit: 0 lb Starting Weight: 284 lb Total Weight Loss (lbs): 11 lb (4.99 kg) Peak Weight: 305 lb   Body Composition  Body Fat %: 29.1 % Fat Mass (lbs): 79.4 lbs Muscle Mass (lbs): 184.2 lbs Total Body Water (lbs): 131.4 lbs Visceral Fat Rating : 14    HPI  Chief Complaint: OBESITY  Devin Osborne is here to discuss his progress with his obesity treatment plan. He is on the the Category 3 Plan and states he is following his eating plan approximately 90 % of the time. He states he is exercising 30 minutes 7 times per week.  Interval History:  Since last office visit he has remained weight neutral. Having worsening constipation on wegovy.  He reports good adherence to reduced calorie nutritional plan. '[]'$ Denies '[x]'$ Reports problems with appetite and hunger signals.  '[]'$ Denies '[x]'$ Reports problems with satiety and satiation.  '[x]'$ Denies '[]'$ Reports problems with eating patterns and portion control.  '[]'$ Denies '[x]'$ Reports abnormal cravings  Barriers identified inability to focus on healthy eating and cost of medication.  24 hour recall: BF-one bagel with cream cheese and bottle of water, [facility]- 1/2 [place] sandwich with veggies. Dinner: cabbage and smoked sausage. Snacks: 5 prunes, sun chips.   Pharmacotherapy for weight loss: He is currently taking Wegovy.   Weight promoting medications identified: Anticholinergics.  ASSESSMENT AND PLAN  TREATMENT PLAN FOR OBESITY:  Recommended Dietary Goals  Sian is currently in the action stage of change. As such, his goal is to continue weight management plan. He has agreed to: the Category 3 Plan.  Behavioral Intervention  We discussed the  following Behavioral Modification Strategies today: increasing lean protein intake, decreasing simple carbohydrates , increasing vegetables, increasing fiber rich foods, increasing water intake, identifying sources and decreasing liquid calories, work on meal planning and easy cooking plans, work on tracking and journaling calories using tracking App, and reading food labels .  Additional resources provided today: Handout on tracking and journaling using Apps  Recommended Physical Activity Goals  Eleftherios has been advised to work up to 150 minutes of moderate intensity aerobic activity a week and strengthening exercises 2-3 times per week for cardiovascular health, weight loss maintenance and preservation of muscle mass.   He has agreed to :  '[x]'$  Continue current level of physical activity  '[]'$  Think about ways to increase physical activity '[]'$  Start strengthening exercises with a goal of 2-3 sessions a week  '[]'$  Start aerobic activity with a goal of 150 minutes a week at moderate intensity.  '[]'$  Increase the intensity, frequency or duration of strengthening exercises  '[]'$  Increase the intensity, frequency or duration of aerobic exercises   '[]'$  Increase physical activity in their day and reduce sedentary time (increase NEAT). '[]'$  Work on scheduling and tracking physical activity.   Pharmacotherapy We discussed various medication options to help Jathniel with his weight loss efforts and we both agreed to : increase Wegovy to 0.5 mg once a week.  ASSOCIATED CONDITIONS ADDRESSED TODAY  Obesity, current BMI 36.3 -     Semaglutide-Weight Management; Inject 0.5 mg into the skin once a week for 28 days.  Dispense: 2 mL; Refill: 0  Vitamin D deficiency Assessment & Plan: Most  recent vitamin D levels  Lab Results  Component Value Date   VD25OH 23.6 (L) 10/01/2022     Deficiency state associated with adiposity and may result in leptin resistance, weight gain and fatigue. Currently on vitamin D  supplementation without any adverse effects.  Plan: Check vitamin D levels consider transitioning to over-the-counter supplementation if replenished.   Orders: -     Ergocalciferol; Take 1 capsule (50,000 Units total) by mouth once a week.  Dispense: 12 capsule; Refill: 0 -     VITAMIN D 25 Hydroxy (Vit-D Deficiency, Fractures)  Prediabetes Assessment & Plan: Most recent A1c is  Lab Results  Component Value Date   HGBA1C 6.0 (H) 10/01/2022   with associated elevated insulin levels.  Patient informed of disease state and risk of progression. This may contribute to abnormal cravings, fatigue and diabetes complications without having diabetes.   We will increase Wegovy 2.5 mg once a week.  Continue with reduced calorie nutrition plan.    Essential hypertension  Drug-induced constipation Assessment & Plan: Medication induced likely secondary to GLP-1.  He also takes anticholinergics.  We counseled on increasing water to about 100 ounces a day and fiber in his diet.  He may also try MiraLAX 1-2 servings a day.  He could also do Dulcolax once daily.  Patient is also considering having screening colonoscopy he is 42 years old and is part of a high risk ethnic group.  Orders: -     Polyethylene Glycol 3350; Take 17 g by mouth 2 (two) times daily as needed.  Dispense: 3350 g; Refill: 1     PHYSICAL EXAM:  Blood pressure 135/86, pulse 72, temperature 98.3 F (36.8 C), height '6\' 1"'$  (1.854 m), weight 273 lb (123.8 kg), SpO2 98 %. Body mass index is 36.02 kg/m.  General: He is overweight, cooperative, alert, well developed, and in no acute distress. PSYCH: Has normal mood, affect and thought process.   HEENT: EOMI, sclerae are anicteric. Lungs: Normal breathing effort, no conversational dyspnea. Extremities: No edema.  Neurologic: No gross sensory or motor deficits. No tremors or fasciculations noted.    DIAGNOSTIC DATA REVIEWED:  BMET    Component Value Date/Time   NA 141  12/12/2020 0806   K 4.1 12/12/2020 0806   CL 102 12/12/2020 0806   CO2 28 12/12/2020 0806   GLUCOSE 101 (H) 12/12/2020 0806   GLUCOSE 84 05/15/2020 1555   BUN 11 12/12/2020 0806   CREATININE 1.07 12/12/2020 0806   CREATININE 0.89 05/15/2020 1555   CALCIUM 9.4 12/12/2020 0806   GFRNONAA 88 12/12/2020 0806   GFRNONAA 109 05/15/2020 1555   GFRAA 101 12/12/2020 0806   GFRAA 127 05/15/2020 1555   Lab Results  Component Value Date   HGBA1C 6.0 (H) 10/01/2022   Lab Results  Component Value Date   INSULIN 16.7 10/01/2022   Lab Results  Component Value Date   TSH 2.010 10/01/2022   CBC    Component Value Date/Time   WBC 9.3 05/15/2020 1555   RBC 5.17 05/15/2020 1555   HGB 15.3 05/15/2020 1555   HCT 43.8 05/15/2020 1555   PLT 241 05/15/2020 1555   MCV 84.7 05/15/2020 1555   MCH 29.6 05/15/2020 1555   MCHC 34.9 05/15/2020 1555   RDW 12.4 05/15/2020 1555   Iron Studies No results found for: "IRON", "TIBC", "FERRITIN", "IRONPCTSAT" Lipid Panel     Component Value Date/Time   CHOL 149 10/01/2022 1005   TRIG 124 10/01/2022 1005   HDL  33 (L) 10/01/2022 1005   CHOLHDL 6.0 (H) 05/15/2020 1555   LDLCALC 93 10/01/2022 1005   LDLCALC 149 (H) 05/15/2020 1555   Hepatic Function Panel     Component Value Date/Time   PROT 7.2 12/12/2020 0806   ALBUMIN 4.8 12/12/2020 0806   AST 23 12/12/2020 0806   ALT 27 12/12/2020 0806   ALKPHOS 93 12/12/2020 0806   BILITOT 0.7 12/12/2020 0806      Component Value Date/Time   TSH 2.010 10/01/2022 1005   Nutritional Lab Results  Component Value Date   VD25OH 23.6 (L) 10/01/2022     Return in about 3 weeks (around 03/02/2023) for For Weight Mangement with Dr. Gerarda Fraction.Marland Kitchen He was informed of the importance of frequent follow up visits to maximize his success with intensive lifestyle modifications for his multiple health conditions.   ATTESTASTION STATEMENTS:  Reviewed by clinician on day of visit: allergies, medications, problem list,  medical history, surgical history, family history, social history, and previous encounter notes.   Time spent on visit including pre-visit chart review and post-visit care and charting was 30 minutes.    Thomes Dinning, MD

## 2023-02-09 NOTE — Assessment & Plan Note (Signed)
Most recent vitamin D levels  Lab Results  Component Value Date   VD25OH 23.6 (L) 10/01/2022     Deficiency state associated with adiposity and may result in leptin resistance, weight gain and fatigue. Currently on vitamin D supplementation without any adverse effects.  Plan: Check vitamin D levels consider transitioning to over-the-counter supplementation if replenished.

## 2023-02-10 LAB — VITAMIN D 25 HYDROXY (VIT D DEFICIENCY, FRACTURES): Vit D, 25-Hydroxy: 36.6 ng/mL (ref 30.0–100.0)

## 2023-02-12 ENCOUNTER — Other Ambulatory Visit (HOSPITAL_COMMUNITY): Payer: Self-pay

## 2023-03-05 ENCOUNTER — Other Ambulatory Visit (HOSPITAL_COMMUNITY): Payer: Self-pay

## 2023-03-05 ENCOUNTER — Encounter (INDEPENDENT_AMBULATORY_CARE_PROVIDER_SITE_OTHER): Payer: Self-pay | Admitting: Internal Medicine

## 2023-03-05 ENCOUNTER — Ambulatory Visit (INDEPENDENT_AMBULATORY_CARE_PROVIDER_SITE_OTHER): Payer: 59 | Admitting: Internal Medicine

## 2023-03-05 VITALS — BP 135/86 | HR 72 | Temp 97.6°F | Ht 73.0 in | Wt 267.0 lb

## 2023-03-05 DIAGNOSIS — E669 Obesity, unspecified: Secondary | ICD-10-CM | POA: Diagnosis not present

## 2023-03-05 DIAGNOSIS — Z6838 Body mass index (BMI) 38.0-38.9, adult: Secondary | ICD-10-CM | POA: Diagnosis not present

## 2023-03-05 DIAGNOSIS — R7303 Prediabetes: Secondary | ICD-10-CM

## 2023-03-05 DIAGNOSIS — E6609 Other obesity due to excess calories: Secondary | ICD-10-CM

## 2023-03-05 MED ORDER — SEMAGLUTIDE-WEIGHT MANAGEMENT 1 MG/0.5ML ~~LOC~~ SOAJ
1.0000 mg | SUBCUTANEOUS | 0 refills | Status: AC
  Filled 2023-03-05 – 2023-03-13 (×2): qty 2, 28d supply, fill #0
  Filled 2023-03-20: qty 2, 28d supply, fill #1

## 2023-03-05 NOTE — Progress Notes (Signed)
Office: [phone]  /  Fax: [phone]  WEIGHT SUMMARY AND BIOMETRICS  Vitals Temp: 97.6 F (36.4 C) BP: 135/86 Pulse Rate: 72 SpO2: 99 %   Anthropometric Measurements Height: 6\' 1"  (1.854 m) Weight: 267 lb (121.1 kg) BMI (Calculated): 35.23 Weight at Last Visit: 273 lb Weight Lost Since Last Visit: 6 lb Starting Weight: 284 lb Total Weight Loss (lbs): 17 lb (7.711 kg) Peak Weight: 305 lb   Body Composition  Body Fat %: 27.9 % Fat Mass (lbs): 74.8 lbs Muscle Mass (lbs): 183.6 lbs Total Body Water (lbs): 129.8 lbs Visceral Fat Rating : 14    HPI  Chief Complaint: OBESITY  Devin Osborne is here to discuss his progress with his obesity treatment plan. He is on the the Category 2 Plan and states he is following his eating plan approximately 95 % of the time. He states he is exercising 60 minutes 7 times per week.  Interval History:  Since last office visit he has lost 6 lbs. On wegovy, had some constipation. Treated with miralax and stool softner.  He reports good adherence to reduced calorie nutritional plan. He has been working on reading food labels, not skipping meals, increasing protein at every meal, drinking more water, making healthier choices, and begun to exercise Denies problems with appetite and hunger signals.  Denies problems with satiety and satiation.  Denies problems with eating patterns and portion control.  Denies abnormal cravings. Denies feeling deprived or restricted.   Barriers identified: none.   Pharmacotherapy for weight loss: He is currently taking Wegovy.  Without side effects   ASSESSMENT AND PLAN  TREATMENT PLAN FOR OBESITY:  Recommended Dietary Goals  Devin Osborne is currently in the action stage of change. As such, his goal is to continue weight management plan. He has agreed to: continue current plan  Behavioral Intervention  We discussed the following Behavioral Modification Strategies today: increasing lean protein intake,  increasing vegetables, increasing water intake, planning for success, and keeping healthy foods at home.  Additional resources provided today: None  Recommended Physical Activity Goals  Devin Osborne has been advised to work up to 150 minutes of moderate intensity aerobic activity a week and strengthening exercises 2-3 times per week for cardiovascular health, weight loss maintenance and preservation of muscle mass.   He has agreed to :  Continue current level of physical activity   Pharmacotherapy We discussed various medication options to help Devin Osborne with his weight loss efforts and we both agreed to : continue with nutritional and behavioral strategies and increase Wegovy to 1 mg once a week.  We again reviewed medication safety, use and management of common side effects  ASSOCIATED CONDITIONS ADDRESSED TODAY  Class 2 obesity due to excess calories without serious comorbidity with body mass index (BMI) of 38.0 to 38.9 in adult  Prediabetes Assessment & Plan: Most recent A1c is  Lab Results  Component Value Date   HGBA1C 6.0 (H) 10/01/2022   with associated elevated insulin levels.  Patient informed of disease state and risk of progression. This may contribute to abnormal cravings, fatigue and diabetes complications without having diabetes.   Patient is doing well on incretin therapy.  He has lost close to 23 pounds.  We will be checking hemoglobin A1c in June.  He will continue with nutritional and behavioral changes.  He is also engaging in regular physical activity.    Other orders -     Semaglutide-Weight Management; Inject 1 mg into the skin once a week.  Dispense:  6 mL; Refill: 0     PHYSICAL EXAM:  Blood pressure 135/86, pulse 72, temperature 97.6 F (36.4 C), height 6\' 1"  (1.854 m), weight 267 lb (121.1 kg), SpO2 99 %. Body mass index is 35.23 kg/m.  General: He is overweight, cooperative, alert, well developed, and in no acute distress. PSYCH: Has normal mood, affect  and thought process.   HEENT: EOMI, sclerae are anicteric. Lungs: Normal breathing effort, no conversational dyspnea. Extremities: No edema.  Neurologic: No gross sensory or motor deficits. No tremors or fasciculations noted.    DIAGNOSTIC DATA REVIEWED:  BMET    Component Value Date/Time   NA 141 12/12/2020 0806   K 4.1 12/12/2020 0806   CL 102 12/12/2020 0806   CO2 28 12/12/2020 0806   GLUCOSE 101 (H) 12/12/2020 0806   GLUCOSE 84 05/15/2020 1555   BUN 11 12/12/2020 0806   CREATININE 1.07 12/12/2020 0806   CREATININE 0.89 05/15/2020 1555   CALCIUM 9.4 12/12/2020 0806   GFRNONAA 88 12/12/2020 0806   GFRNONAA 109 05/15/2020 1555   GFRAA 101 12/12/2020 0806   GFRAA 127 05/15/2020 1555   Lab Results  Component Value Date   HGBA1C 6.0 (H) 10/01/2022   Lab Results  Component Value Date   INSULIN 16.7 10/01/2022   Lab Results  Component Value Date   TSH 2.010 10/01/2022   CBC    Component Value Date/Time   WBC 9.3 05/15/2020 1555   RBC 5.17 05/15/2020 1555   HGB 15.3 05/15/2020 1555   HCT 43.8 05/15/2020 1555   PLT 241 05/15/2020 1555   MCV 84.7 05/15/2020 1555   MCH 29.6 05/15/2020 1555   MCHC 34.9 05/15/2020 1555   RDW 12.4 05/15/2020 1555   Iron Studies No results found for: "IRON", "TIBC", "FERRITIN", "IRONPCTSAT" Lipid Panel     Component Value Date/Time   CHOL 149 10/01/2022 1005   TRIG 124 10/01/2022 1005   HDL 33 (L) 10/01/2022 1005   CHOLHDL 6.0 (H) 05/15/2020 1555   LDLCALC 93 10/01/2022 1005   LDLCALC 149 (H) 05/15/2020 1555   Hepatic Function Panel     Component Value Date/Time   PROT 7.2 12/12/2020 0806   ALBUMIN 4.8 12/12/2020 0806   AST 23 12/12/2020 0806   ALT 27 12/12/2020 0806   ALKPHOS 93 12/12/2020 0806   BILITOT 0.7 12/12/2020 0806      Component Value Date/Time   TSH 2.010 10/01/2022 1005   Nutritional Lab Results  Component Value Date   VD25OH 36.6 02/09/2023   VD25OH 23.6 (L) 10/01/2022     Return in about 4 weeks  (around 04/02/2023) for For Weight Mangement with Dr. Gerarda Fraction.Marland Kitchen He was informed of the importance of frequent follow up visits to maximize his success with intensive lifestyle modifications for his multiple health conditions.   ATTESTASTION STATEMENTS:  Reviewed by clinician on day of visit: allergies, medications, problem list, medical history, surgical history, family history, social history, and previous encounter notes.     Devin Dinning, MD

## 2023-03-05 NOTE — Assessment & Plan Note (Signed)
Most recent A1c is  Lab Results  Component Value Date   HGBA1C 6.0 (H) 10/01/2022   with associated elevated insulin levels.  Patient informed of disease state and risk of progression. This may contribute to abnormal cravings, fatigue and diabetes complications without having diabetes.   Patient is doing well on incretin therapy.  He has lost close to 23 pounds.  We will be checking hemoglobin A1c in June.  He will continue with nutritional and behavioral changes.  He is also engaging in regular physical activity.

## 2023-03-06 ENCOUNTER — Other Ambulatory Visit (HOSPITAL_COMMUNITY): Payer: Self-pay

## 2023-03-06 DIAGNOSIS — H5212 Myopia, left eye: Secondary | ICD-10-CM | POA: Diagnosis not present

## 2023-03-12 DIAGNOSIS — Z302 Encounter for sterilization: Secondary | ICD-10-CM | POA: Diagnosis not present

## 2023-03-13 ENCOUNTER — Other Ambulatory Visit (HOSPITAL_COMMUNITY): Payer: Self-pay

## 2023-03-19 ENCOUNTER — Ambulatory Visit: Payer: Self-pay | Admitting: Allergy

## 2023-03-20 ENCOUNTER — Other Ambulatory Visit (HOSPITAL_COMMUNITY): Payer: Self-pay

## 2023-04-01 ENCOUNTER — Encounter: Payer: Self-pay | Admitting: Allergy

## 2023-04-01 ENCOUNTER — Ambulatory Visit: Payer: 59 | Admitting: Allergy

## 2023-04-01 ENCOUNTER — Other Ambulatory Visit (HOSPITAL_COMMUNITY): Payer: Self-pay

## 2023-04-01 ENCOUNTER — Other Ambulatory Visit: Payer: Self-pay

## 2023-04-01 VITALS — BP 132/80 | HR 71 | Temp 97.9°F | Resp 16

## 2023-04-01 DIAGNOSIS — J3089 Other allergic rhinitis: Secondary | ICD-10-CM | POA: Diagnosis not present

## 2023-04-01 DIAGNOSIS — K9049 Malabsorption due to intolerance, not elsewhere classified: Secondary | ICD-10-CM | POA: Diagnosis not present

## 2023-04-01 DIAGNOSIS — H1013 Acute atopic conjunctivitis, bilateral: Secondary | ICD-10-CM | POA: Diagnosis not present

## 2023-04-01 DIAGNOSIS — L2089 Other atopic dermatitis: Secondary | ICD-10-CM | POA: Diagnosis not present

## 2023-04-01 DIAGNOSIS — T781XXD Other adverse food reactions, not elsewhere classified, subsequent encounter: Secondary | ICD-10-CM

## 2023-04-01 MED ORDER — IPRATROPIUM BROMIDE 0.03 % NA SOLN
2.0000 | Freq: Two times a day (BID) | NASAL | 5 refills | Status: DC | PRN
  Filled 2023-04-01 – 2023-04-13 (×2): qty 30, 43d supply, fill #0

## 2023-04-01 MED ORDER — FLUTICASONE PROPIONATE 50 MCG/ACT NA SUSP
2.0000 | Freq: Every day | NASAL | 5 refills | Status: DC | PRN
  Filled 2023-04-01 – 2023-04-13 (×2): qty 16, 30d supply, fill #0
  Filled 2023-11-19: qty 16, 30d supply, fill #1

## 2023-04-01 NOTE — Progress Notes (Unsigned)
Follow-up Note  RE: Devin Osborne MRN: [id number] DOB: [birth year] Date of Office Visit: 04/01/2023   History of present illness: Devin Osborne is a 42 y.o. male presenting today for follow-up of allergic rhinitis with conjunctivitis, eczema, oral allergy syndrome and food intolerance.  He was last seen in the office on 10/15/22 by myself. He has had weight loss intentionally and he is on Wegovy.  This is a new medicine since his last visit with me. In regards to his allergies he is taking Xyzal and Singulair as well as using his nasal sprays Flonase and ipratropium and his eyedrop Pataday.  He is noticing more allergy symptoms during this season and has had to use extra Xyzal on occasion.  We have previously discussed allergen immunotherapy to better his control in help him be able to use less medications for management.  He is becoming more interested in this option. With his eczema he states it is not as bad currently.  He has had several episodes of feet, thigh and arm itching.  He does moisturize after bathing.  He has used both steroid triamcinolone as well as nonsteroid Elidel as needed use. He continues to eat gluten products in moderation.  He is not completely avoiding.Marland Kitchen He does try to avoid/limit nuts related to oral allergy syndrome  Review of systems: Review of Systems  Constitutional: Negative.   HENT:  Positive for congestion, postnasal drip and sneezing.   Eyes:  Positive for itching.  Respiratory: Negative.    Cardiovascular: Negative.   Musculoskeletal: Negative.   Skin: Negative.   Allergic/Immunologic: Negative.   Neurological: Negative.      All other systems negative unless noted above in HPI  Past medical/social/surgical/family history have been reviewed and are unchanged unless specifically indicated below.  No changes  Medication List: Current Outpatient Medications  Medication Sig Dispense Refill   ergocalciferol (VITAMIN D2) 1.25 MG  (50000 UT) capsule Take 1 capsule (50,000 Units total) by mouth once a week. 12 capsule 0   fluticasone (FLONASE) 50 MCG/ACT nasal spray Place 2 sprays in each nostril once a day as needed for stuffy nose 16 g 5   ipratropium (ATROVENT) 0.03 % nasal spray Place 2 sprays in each nostril twice a day as needed for runny nose/drainage down throat 30 mL 5   levocetirizine (XYZAL) 5 MG tablet TAKE 1 TABLET BY MOUTH EVERY EVENING 90 tablet 1   montelukast (SINGULAIR) 10 MG tablet TAKE 1 TABLET BY MOUTH ONCE DAILY AT BEDTIME. 90 tablet 3   pimecrolimus (ELIDEL) 1 % cream Apply topically 2 (two) times daily. 30 g 5   polyethylene glycol powder (GLYCOLAX/MIRALAX) 17 GM/SCOOP powder Take 17 g by mouth 2 (two) times daily as needed. 3350 g 1   Semaglutide-Weight Management 1 MG/0.5ML SOAJ Inject 1 mg into the skin once a week. 6 mL 0   triamcinolone ointment (KENALOG) 0.1 % Apply 1 Application topically 2 (two) times daily. 45 g 3   dicyclomine (BENTYL) 20 MG tablet Take 1 tablet (20 mg total) by mouth 3 (three) times daily as needed for abdominal cramp for 7 days (Patient not taking: Reported on 04/01/2023) 21 tablet 0   rosuvastatin (CRESTOR) 5 MG tablet Take 1 tablet (5 mg total) by mouth daily. NEEDS APPT/LABS (Patient not taking: Reported on 04/01/2023) 90 tablet 0   No current facility-administered medications for this visit.     Known medication allergies: Allergies  Allergen Reactions   Other Other (See Comments)  Physical examination: Blood pressure 132/80, pulse 71, temperature 97.9 F (36.6 C), temperature source Temporal, resp. rate 16, SpO2 95 %.  General: Alert, interactive, in no acute distress. HEENT: PERRLA, TMs pearly gray, turbinates mildly edematous without discharge, post-pharynx non erythematous. Neck: Supple without lymphadenopathy. Lungs: Clear to auscultation without wheezing, rhonchi or rales. {no increased work of breathing. CV: Normal S1, S2 without murmurs. Abdomen:  Nondistended, nontender. Skin: Warm and dry, without lesions or rashes. Extremities:  No clubbing, cyanosis or edema. Neuro:   Grossly intact.  Diagnositics/Labs: None today  Assessment and plan:   Allergic rhinitis with conjunctivitis (positive to grass pollen, tree pollen, molds) - continue allergen avoidance measures  - continue Xyzal 5mg  daily.  Can increase to twice a day as needed to help with itching.  - continue fluticasone nasal spray using 1 to 2 sprays each nostril once a day as needed for stuffy nose.   - continue ipratropium bromide nasal spray 0.03% 2 sprays each nostril twice a day as needed for runny nose/drainage down the throat.   - continue singulair 10mg  daily at bedtime - for itchy/watery eyes recommend use of Pataday 1 drop each eye daily as needed - allergen immunotherapy discussed previously including protocol, benefits and risk.  If medication management is not effective then consider allergen immunotherapy as therapy option  Eczema - continue moisturization after bathing.  Some good moisturizing options are Eucerin cream, Cetaphil, or CeraVe.   Will send in triamcinolone cream that can be mixed equally with your cream moisturizer for daily application.   - for dry, itchy, irritated, patchy, flaky or scaly areas can use Triamcinolone ointment 0.1% twice a day as needed.  This is to be used as needed on the body - for itchy, dry, irritated, patchy, flaky or scaly areas can use non-steroid ointment, Elidel.  This can be used anywhere on the body including armpits - keep fingernails trimmed - consider patch testing is the test of choice to evaluate for contact dermatitis.  Patches are best placed on a Monday with return to office on Wednesday and Friday of same week for readings.  Once patches are in place to do not get them wet.  You can take antihistamines while patches are in place.   Oral allergy syndrome - oral allergy syndrome (OAS) or pollen-food allergy  syndrome (PFAS) is a relatively common form of food allergy, particularly in adults. It typically occurs in people who have pollen allergies when the immune system "sees" proteins on the food that look like proteins on the pollen. This results in the allergy antibody (IgE) binding to the food instead of the pollen. Patients typically report itching and/or mild swelling of the mouth and throat immediately following ingestion of certain uncooked fruits (including nuts) or raw vegetables. Only a very small number of affected individuals experience systemic allergic reactions, such as anaphylaxis which occurs with true food allergies.  -this is most likely what is happening when you eat certain nuts  Food intolerance - continue to avoid gluten products to prevent symptoms  Follow-up in 6 months or sooner if needed   I appreciate the opportunity to take part in Pax's care. Please do not hesitate to contact me with questions.  Sincerely,   Margo Aye, MD Allergy/Immunology [facility]

## 2023-04-01 NOTE — Patient Instructions (Addendum)
Allergic rhinitis with conjunctivitis (positive to grass pollen, tree pollen, molds) - continue allergen avoidance measures  - continue Xyzal 5mg  daily.  Can increase to twice a day as needed to help with itching.  - continue fluticasone nasal spray using 1 to 2 sprays each nostril once a day as needed for stuffy nose.   - continue ipratropium bromide nasal spray 0.03% 2 sprays each nostril twice a day as needed for runny nose/drainage down the throat.   - continue singulair 10mg  daily at bedtime - for itchy/watery eyes recommend use of Pataday 1 drop each eye daily as needed - allergen immunotherapy discussed previously including protocol, benefits and risk.  If medication management is not effective then consider allergen immunotherapy as therapy option  Eczema - continue moisturization after bathing.  Some good moisturizing options are Eucerin cream, Cetaphil, or CeraVe.   Will send in triamcinolone cream that can be mixed equally with your cream moisturizer for daily application.   - for dry, itchy, irritated, patchy, flaky or scaly areas can use Triamcinolone ointment 0.1% twice a day as needed.  This is to be used as needed on the body - for itchy, dry, irritated, patchy, flaky or scaly areas can use non-steroid ointment, Elidel.  This can be used anywhere on the body including armpits - keep fingernails trimmed - consider patch testing is the test of choice to evaluate for contact dermatitis.  Patches are best placed on a Monday with return to office on Wednesday and Friday of same week for readings.  Once patches are in place to do not get them wet.  You can take antihistamines while patches are in place.   Oral allergy syndrome - oral allergy syndrome (OAS) or pollen-food allergy syndrome (PFAS) is a relatively common form of food allergy, particularly in adults. It typically occurs in people who have pollen allergies when the immune system "sees" proteins on the food that look like  proteins on the pollen. This results in the allergy antibody (IgE) binding to the food instead of the pollen. Patients typically report itching and/or mild swelling of the mouth and throat immediately following ingestion of certain uncooked fruits (including nuts) or raw vegetables. Only a very small number of affected individuals experience systemic allergic reactions, such as anaphylaxis which occurs with true food allergies.  -this is most likely what is happening when you eat certain nuts  Food intolerance - continue to avoid gluten products to prevent symptoms  Follow-up in 6 months or sooner if needed

## 2023-04-02 ENCOUNTER — Ambulatory Visit (INDEPENDENT_AMBULATORY_CARE_PROVIDER_SITE_OTHER): Payer: 59 | Admitting: Internal Medicine

## 2023-04-06 DIAGNOSIS — J301 Allergic rhinitis due to pollen: Secondary | ICD-10-CM | POA: Diagnosis not present

## 2023-04-07 DIAGNOSIS — J302 Other seasonal allergic rhinitis: Secondary | ICD-10-CM | POA: Diagnosis not present

## 2023-04-07 NOTE — Progress Notes (Signed)
VIALS EXP 04-06-24 

## 2023-04-07 NOTE — Progress Notes (Signed)
Aeroallergen Immunotherapy   Ordering Provider: Dr. Margo Aye   Patient Details  Name: Devin Osborne  MRN: [id number]  Date of Birth: 1981/05/07   Order 1 of 2   Vial Label: Pollen   0.3 ml (Volume)  BAU Concentration -- 7 Grass Mix* 100,000 ([address] [place], [place], [place], Perennial [place], [place], Sweet Vernal, Timothy)  0.5 ml (Volume)  1:20 Concentration -- Eastern 10 Tree Mix (also Sweet Gum)    0.8  ml Extract Subtotal  4.2  ml Diluent  5.0  ml Maintenance Total   Schedule:  B   Blue Vial (1:100,000): Schedule B (6 doses)  Yellow Vial (1:10,000): Schedule B (6 doses)  Green Vial (1:1,000): Schedule B (6 doses)  Red Vial (1:100): Schedule A (14 doses)   Special Instructions: 1-2 inj/week

## 2023-04-07 NOTE — Addendum Note (Signed)
Addended by: Lorrin Mais on: 04/07/2023 11:45 AM   Modules accepted: Orders

## 2023-04-07 NOTE — Progress Notes (Signed)
Aeroallergen Immunotherapy  Ordering Provider: Dr. Margo Aye  Patient Details Name: Devin Osborne MRN: [id number] Date of Birth: June 11, 1981  Order 2 of 2  Vial Label: Mold  0.2 ml (Volume)  1:20 Concentration -- Alternaria alternata 0.2 ml (Volume)  1:20 Concentration -- Cladosporium herbarum 0.2 ml (Volume)  1:10 Concentration -- Aspergillus mix 0.2 ml (Volume)  1:10 Concentration -- Penicillium mix 0.2 ml (Volume)  1:20 Concentration -- Drechslera spicifera 0.2 ml (Volume)  1:10 Concentration -- Fusarium moniliforme   1.2  ml Extract Subtotal 3.8  ml Diluent 5.0  ml Maintenance Total  Schedule:  B  Blue Vial (1:100,000): Schedule B (6 doses) Yellow Vial (1:10,000): Schedule B (6 doses) Green Vial (1:1,000): Schedule B (6 doses) Red Vial (1:100): Schedule A (14 doses)  Special Instructions: 1-2 inj/week

## 2023-04-09 ENCOUNTER — Other Ambulatory Visit (HOSPITAL_COMMUNITY): Payer: Self-pay

## 2023-04-13 ENCOUNTER — Other Ambulatory Visit: Payer: Self-pay

## 2023-04-13 ENCOUNTER — Other Ambulatory Visit: Payer: Self-pay | Admitting: Allergy

## 2023-04-13 ENCOUNTER — Other Ambulatory Visit (HOSPITAL_COMMUNITY): Payer: Self-pay

## 2023-04-13 DIAGNOSIS — H1013 Acute atopic conjunctivitis, bilateral: Secondary | ICD-10-CM

## 2023-04-13 DIAGNOSIS — J3089 Other allergic rhinitis: Secondary | ICD-10-CM

## 2023-04-13 MED ORDER — MONTELUKAST SODIUM 10 MG PO TABS
10.0000 mg | ORAL_TABLET | Freq: Every day | ORAL | 1 refills | Status: DC
Start: 2023-04-13 — End: 2023-05-19
  Filled 2023-04-13: qty 90, 90d supply, fill #0

## 2023-04-22 ENCOUNTER — Ambulatory Visit: Payer: 59

## 2023-05-19 ENCOUNTER — Other Ambulatory Visit: Payer: Self-pay

## 2023-05-19 DIAGNOSIS — J3089 Other allergic rhinitis: Secondary | ICD-10-CM

## 2023-05-19 DIAGNOSIS — H1013 Acute atopic conjunctivitis, bilateral: Secondary | ICD-10-CM

## 2023-05-19 MED ORDER — MONTELUKAST SODIUM 10 MG PO TABS
10.0000 mg | ORAL_TABLET | Freq: Every day | ORAL | 1 refills | Status: DC
Start: 2023-05-19 — End: 2023-10-02

## 2023-07-27 ENCOUNTER — Other Ambulatory Visit (HOSPITAL_COMMUNITY): Payer: Self-pay

## 2023-07-27 ENCOUNTER — Ambulatory Visit (INDEPENDENT_AMBULATORY_CARE_PROVIDER_SITE_OTHER): Payer: BC Managed Care – PPO | Admitting: Podiatry

## 2023-07-27 DIAGNOSIS — G5762 Lesion of plantar nerve, left lower limb: Secondary | ICD-10-CM | POA: Diagnosis not present

## 2023-07-27 DIAGNOSIS — M24572 Contracture, left ankle: Secondary | ICD-10-CM | POA: Diagnosis not present

## 2023-07-27 DIAGNOSIS — B353 Tinea pedis: Secondary | ICD-10-CM | POA: Diagnosis not present

## 2023-07-27 MED ORDER — KETOCONAZOLE 2 % EX CREA
1.0000 | TOPICAL_CREAM | Freq: Every day | CUTANEOUS | 2 refills | Status: AC
Start: 2023-07-27 — End: ?
  Filled 2023-07-27: qty 60, 20d supply, fill #0

## 2023-07-27 NOTE — Patient Instructions (Signed)

## 2023-07-27 NOTE — Progress Notes (Unsigned)
      Chief Complaint  Patient presents with   Foot Pain    2-3 wks ago started having pain and burning in the ball of the left foot  Pt is having itching bilat feet.     HPI: 42 y.o. male presents today with concern of itching/rash in between the 4th and 5th toes bilateral.  Also having pain in the ball of the left foot.  Denies injury, bruising or swelling.  Past Medical History:  Diagnosis Date   ADD (attention deficit disorder)    Constipation    Eczema 03/07/2013   Hemorrhoid 06/20/2016   High cholesterol    History of nasal polyp 03/07/2013   Hyperlipidemia 10/03/2013   IBS (irritable bowel syndrome)    Seasonal allergies 03/07/2013    Allergies  Allergen Reactions   Other Other (See Comments)    Physical Exam: General: The patient is alert and oriented x3 in no acute distress.  Dermatology: Skin is warm, dry and supple bilateral lower extremities. Interspaces are clear of maceration and debris.    Vascular: Palpable pedal pulses bilaterally. Capillary refill within normal limits.  No appreciable edema.  No erythema or calor.  Neurological: Light touch sensation grossly intact bilateral feet.   Musculoskeletal Exam: Tight gastroc soleal complex bilateral.  Ankle dorsiflexion less than 10 degrees with the knee extended bilateral.  Pain with compression of the left second interspace.  Negative Mulder sign.  Assessment/Plan of Care: 1. Tinea pedis of both feet   2. Equinus contracture of left ankle   3. Neuroma of second interspace of left foot     Meds ordered this encounter  Medications   ketoconazole (NIZORAL) 2 % cream    Sig: Apply 1 Application topically daily.    Dispense:  60 g    Refill:  2   Discussed clinical findings with patient today.  Prescription for ketoconazole was sent in for him to apply to the interspaces daily for at least 3 to 4 weeks.  Call if there is no improvement.  Patient was given printed stretching exercises to work on the tight  calf muscles.  The tight calves are contributing to early heel lift and placing more pressure on the ball of the foot which could be aggravating the neuroma/neuralgia on the left forefoot.  If he does not feel that he is getting any improvement with this may send for physical therapy.  Will hold off on a cortisone injection for the left second interspace today regarding the neuroma.  He was fitted for power step arch supports to see if this will help splay the metatarsal heads and decrease pressure on the nerve.  He was informed if this is not successful he needed to have a cortisone injection in the left second interspace.  Follow-up as needed   Clerance Lav, DPM, FACFAS [facility]     [address] [place], [place] [address]                Office [phone]  Fax [phone]

## 2023-08-31 ENCOUNTER — Ambulatory Visit: Payer: BC Managed Care – PPO | Admitting: Podiatry

## 2023-08-31 ENCOUNTER — Encounter: Payer: Self-pay | Admitting: Podiatry

## 2023-08-31 DIAGNOSIS — G5762 Lesion of plantar nerve, left lower limb: Secondary | ICD-10-CM | POA: Diagnosis not present

## 2023-08-31 DIAGNOSIS — B353 Tinea pedis: Secondary | ICD-10-CM

## 2023-08-31 NOTE — Progress Notes (Signed)
Devin Osborne, cream is working on the tinea pedis.  There is still little maceration fourth interspace bilateral.  Betadine was applied here.  He was instructed to use Betadine 2-3 times a week between the fourth and fifth toes at home and continue with the ketoconazole cream.  Neuroma pain has resolved.       Chief Complaint  Patient presents with   Skin Problem    Follow up tinea bilateral   "I've been told they are looking a lot better"   Foot Pain    Follow up neuroma 2nd IDS left   "Its feeling better. I really haven't had anymore pain"    HPI: 42 y.o. male presents today for follow-up of left second interspace neuroma.  He states is not having any more pain in this area.  He states that he is wearing his power step inserts but they are not in his current shoes today.  He has been using the ketoconazole cream daily for his tinea pedis.  States that he does not have any itching to the area.  His wife usually applies it and states that his skin looks much better than it did previously.  He is pleased overall.  Patient states he went to [employer] and got measured and went up a whole shoe to size 13 and feels that a lot of his symptoms overall have improved since going to a larger size shoe.  Past Medical History:  Diagnosis Date   ADD (attention deficit disorder)    Constipation    Eczema 03/07/2013   Hemorrhoid 06/20/2016   High cholesterol    History of nasal polyp 03/07/2013   Hyperlipidemia 10/03/2013   IBS (irritable bowel syndrome)    Seasonal allergies 03/07/2013    No past surgical history on file.  Allergies  Allergen Reactions   Other Other (See Comments)    Physical Exam: General: The patient is alert and oriented x3 in no acute distress.  Dermatology: Skin is warm, dry and supple bilateral lower extremities.  There remains some maceration in the fourth interspace bilateral.  No edema or erythema is noted.  No peeling is noted to the plantar aspect of the skin of the  feet.  Vascular: Palpable pedal pulses bilaterally. Capillary refill within normal limits.  No appreciable edema.  No erythema or calor.  Neurological: Light touch sensation grossly intact bilateral feet.   Musculoskeletal Exam: No pain on palpation to the ball of the foot.    Radiographic Exam:  Normal osseous mineralization. Joint spaces preserved.  No fractures or osseous irregularities noted.  Assessment/Plan of Care: 1. Tinea pedis of both feet   2. Neuroma of second interspace of left foot     Discussed clinical findings with patient today.  Betadine was applied to the fourth interspace bilateral.  Will continue with the ketoconazole cream 2% every day to feet & interspaces.  he will call if he needs any refills.  Continue with the new shoes and arch supports.  Follow-up prn   Clerance Lav, DPM, FACFAS [facility]     [address] [place], [place] [address]                Office [phone]  Fax [phone]

## 2023-10-02 ENCOUNTER — Ambulatory Visit: Payer: BC Managed Care – PPO | Admitting: Allergy

## 2023-10-02 ENCOUNTER — Encounter: Payer: Self-pay | Admitting: Allergy

## 2023-10-02 ENCOUNTER — Other Ambulatory Visit: Payer: Self-pay

## 2023-10-02 VITALS — BP 132/92 | HR 72 | Temp 98.0°F | Resp 16 | Ht 73.0 in | Wt 276.2 lb

## 2023-10-02 DIAGNOSIS — J3089 Other allergic rhinitis: Secondary | ICD-10-CM

## 2023-10-02 DIAGNOSIS — T781XXD Other adverse food reactions, not elsewhere classified, subsequent encounter: Secondary | ICD-10-CM | POA: Diagnosis not present

## 2023-10-02 DIAGNOSIS — H1013 Acute atopic conjunctivitis, bilateral: Secondary | ICD-10-CM | POA: Diagnosis not present

## 2023-10-02 DIAGNOSIS — Z23 Encounter for immunization: Secondary | ICD-10-CM

## 2023-10-02 DIAGNOSIS — L2089 Other atopic dermatitis: Secondary | ICD-10-CM

## 2023-10-02 MED ORDER — IPRATROPIUM BROMIDE 0.03 % NA SOLN: 2.0000 | Freq: Two times a day (BID) | NASAL | 5 refills | Status: DC | PRN

## 2023-10-02 MED ORDER — PIMECROLIMUS 1 % EX CREA: TOPICAL_CREAM | Freq: Two times a day (BID) | CUTANEOUS | 5 refills | Status: AC

## 2023-10-02 MED ORDER — MONTELUKAST SODIUM 10 MG PO TABS: 10.0000 mg | ORAL_TABLET | Freq: Every day | ORAL | 1 refills | Status: DC

## 2023-10-02 MED ORDER — LEVOCETIRIZINE DIHYDROCHLORIDE 5 MG PO TABS: 5.0000 mg | ORAL_TABLET | Freq: Every evening | ORAL | 1 refills | Status: DC

## 2023-10-02 NOTE — Progress Notes (Signed)
Follow-up Note  RE: Devin Osborne MRN: [id number] DOB: 08/08/1981 Date of Office Visit: 10/02/2023   History of present illness: Devin Osborne is a 42 y.o. male presenting today for follow-up of allergic rhinitis with conjunctivitis, oral allergy syndrome and eczema.  He was last seen in the office on 04/01/2023 by myself.  Discussed the use of AI scribe software for clinical note transcription with the patient, who gave verbal consent to proceed.  He reports no significant health changes, new medical issues, surgeries, or hospitalizations since the last visit in May. He denies any respiratory illnesses, including COVID-19. The patient was due to start allergy shots, but due to a transition and change in insurance, the process was delayed. He expresses a desire to restart the process and confirm the coverage with his new insurance. Over the summer and fall, the patient's allergies have been relatively controlled, with only occasional episodes. The severity of these episodes was less than those experienced in May. He has been managing his symptoms with Wallis Bamberg and has occasionally needed to double the dose. He reports a noticeable difference in symptom control when the dose is increased. He has also been using a nasal spray more consistently, which he reports has been beneficial. The patient denies needing to use his prescribed eye drops much and continues to avoid foods that previously caused mouth irritation. He also reports a decrease in the need for triamcinolone for his skin, but anticipates needing to use it more as the weather gets colder. He has been diligent about moisturizing after bathing to prevent dry skin and itching. The patient requests refills for all his allergy medications.  He is interested in receiving the flu vaccine.   Review of systems: 10pt ROS negative unless noted above in HPI  All other systems negative unless noted above in HPI  Past  medical/social/surgical/family history have been reviewed and are unchanged unless specifically indicated below.  No changes  Medication List: Current Outpatient Medications  Medication Sig Dispense Refill   dicyclomine (BENTYL) 20 MG tablet Take 1 tablet (20 mg total) by mouth 3 (three) times daily as needed for abdominal cramp for 7 days 21 tablet 0   fluticasone (FLONASE) 50 MCG/ACT nasal spray Place 2 sprays into both nostrils daily as needed for stuffy nose 16 g 5   ipratropium (ATROVENT) 0.03 % nasal spray Place 2 sprays into both nostrils 2 (two) times daily as needed for runny nose/drainage down throat 30 mL 5   ketoconazole (NIZORAL) 2 % cream Apply 1 Application topically daily. 60 g 2   levocetirizine (XYZAL) 5 MG tablet TAKE 1 TABLET BY MOUTH EVERY EVENING 90 tablet 1   montelukast (SINGULAIR) 10 MG tablet Take 1 tablet (10 mg total) by mouth at bedtime. 90 tablet 1   pimecrolimus (ELIDEL) 1 % cream Apply topically 2 (two) times daily. 30 g 5   polyethylene glycol powder (GLYCOLAX/MIRALAX) 17 GM/SCOOP powder Take 17 g by mouth 2 (two) times daily as needed. 3350 g 1   ergocalciferol (VITAMIN D2) 1.25 MG (50000 UT) capsule Take 1 capsule (50,000 Units total) by mouth once a week. (Patient not taking: Reported on 10/02/2023) 12 capsule 0   rosuvastatin (CRESTOR) 5 MG tablet Take 1 tablet (5 mg total) by mouth daily. NEEDS APPT/LABS (Patient not taking: Reported on 10/02/2023) 90 tablet 0   triamcinolone ointment (KENALOG) 0.1 % Apply 1 Application topically 2 (two) times daily. (Patient not taking: Reported on 10/02/2023) 45 g 3  No current facility-administered medications for this visit.     Known medication allergies: Allergies  Allergen Reactions   Other Other (See Comments)     Physical examination: Blood pressure (!) 132/92, pulse 72, temperature 98 F (36.7 C), temperature source Temporal, resp. rate 16, height 6\' 1"  (1.854 m), weight 276 lb 3.2 oz (125.3 kg), SpO2  96%.  General: Alert, interactive, in no acute distress. HEENT: PERRLA, TMs pearly gray, turbinates mildly edematous without discharge, post-pharynx non erythematous. Neck: Supple without lymphadenopathy. Lungs: Clear to auscultation without wheezing, rhonchi or rales. {no increased work of breathing. CV: Normal S1, S2 without murmurs. Abdomen: Nondistended, nontender. Skin: Warm and dry, without lesions or rashes. Extremities:  No clubbing, cyanosis or edema. Neuro:   Grossly intact.  Diagnositics/Labs: Flu vaccination given today   Assessment and plan:   Allergic rhinitis with conjunctivitis (positive to grass pollen, tree pollen, molds) - continue allergen avoidance measures  - continue Xyzal 5mg  daily.  Can increase to twice a day as needed to help with itching.  - continue fluticasone nasal spray using 1 to 2 sprays each nostril once a day as needed for stuffy nose.   - continue ipratropium bromide nasal spray 0.03% 2 sprays each nostril twice a day as needed for runny nose/drainage down the throat.   - continue singulair 10mg  daily at bedtime - for itchy/watery eyes recommend use of Pataday 1 drop each eye daily as needed - allergy vials have been made and are good until 04/06/24.  You can check with your new insurance regarding coverage and then schedule a new start appointment.    Eczema - continue moisturization after bathing.  Some good moisturizing options are Eucerin cream, Cetaphil, or CeraVe.  - for dry, itchy, irritated, patchy, flaky or scaly areas can use Triamcinolone ointment 0.1% twice a day as needed.  This is to be used as needed on the body - for itchy, dry, irritated, patchy, flaky or scaly areas can use non-steroid ointment, Elidel.  This can be used anywhere on the body including armpits - keep fingernails trimmed  Oral allergy syndrome - oral allergy syndrome (OAS) or pollen-food allergy syndrome (PFAS) is a relatively common form of food allergy,  particularly in adults. It typically occurs in people who have pollen allergies when the immune system "sees" proteins on the food that look like proteins on the pollen. This results in the allergy antibody (IgE) binding to the food instead of the pollen. Patients typically report itching and/or mild swelling of the mouth and throat immediately following ingestion of certain uncooked fruits (including nuts) or raw vegetables. Only a very small number of affected individuals experience systemic allergic reactions, such as anaphylaxis which occurs with true food allergies.  -this is most likely what is happening when you eat certain nuts  Food intolerance - continue to avoid gluten products to prevent symptoms  Flu vaccine given in office today  Follow-up in 6 months or sooner if needed   I appreciate the opportunity to take part in Kagen's care. Please do not hesitate to contact me with questions.  Sincerely,   Margo Aye, MD Allergy/Immunology [facility]

## 2023-10-02 NOTE — Patient Instructions (Addendum)
Allergic rhinitis with conjunctivitis (positive to grass pollen, tree pollen, molds) - continue allergen avoidance measures  - continue Xyzal 5mg  daily.  Can increase to twice a day as needed to help with itching.  - continue fluticasone nasal spray using 1 to 2 sprays each nostril once a day as needed for stuffy nose.   - continue ipratropium bromide nasal spray 0.03% 2 sprays each nostril twice a day as needed for runny nose/drainage down the throat.   - continue singulair 10mg  daily at bedtime - for itchy/watery eyes recommend use of Pataday 1 drop each eye daily as needed - allergy vials have been made and are good until 04/06/24.  You can check with your new insurance regarding coverage and then schedule a new start appointment.    Eczema - continue moisturization after bathing.  Some good moisturizing options are Eucerin cream, Cetaphil, or CeraVe.  - for dry, itchy, irritated, patchy, flaky or scaly areas can use Triamcinolone ointment 0.1% twice a day as needed.  This is to be used as needed on the body - for itchy, dry, irritated, patchy, flaky or scaly areas can use non-steroid ointment, Elidel.  This can be used anywhere on the body including armpits - keep fingernails trimmed  Oral allergy syndrome - oral allergy syndrome (OAS) or pollen-food allergy syndrome (PFAS) is a relatively common form of food allergy, particularly in adults. It typically occurs in people who have pollen allergies when the immune system "sees" proteins on the food that look like proteins on the pollen. This results in the allergy antibody (IgE) binding to the food instead of the pollen. Patients typically report itching and/or mild swelling of the mouth and throat immediately following ingestion of certain uncooked fruits (including nuts) or raw vegetables. Only a very small number of affected individuals experience systemic allergic reactions, such as anaphylaxis which occurs with true food allergies.  -this is  most likely what is happening when you eat certain nuts  Food intolerance - continue to avoid gluten products to prevent symptoms  Flu vaccine given in office today  Follow-up in 6 months or sooner if needed

## 2023-10-08 ENCOUNTER — Telehealth: Payer: Self-pay

## 2023-10-08 NOTE — Telephone Encounter (Signed)
*  Asthma/Allergy  Pharmacy Patient Advocate Encounter  Received notification from [facility] that Prior Authorization for Pimecrolimus 1% cream  has been APPROVED from 10/08/2023 to 10/06/2024   PA #/Case ID/Reference #: [id number]

## 2023-10-12 ENCOUNTER — Other Ambulatory Visit (HOSPITAL_COMMUNITY): Payer: Self-pay

## 2023-10-12 MED ORDER — ROSUVASTATIN CALCIUM 5 MG PO TABS
5.0000 mg | ORAL_TABLET | Freq: Every day | ORAL | 5 refills | Status: AC
  Filled 2023-10-12: qty 90, 90d supply, fill #0

## 2023-10-12 MED ORDER — LOSARTAN POTASSIUM 50 MG PO TABS
50.0000 mg | ORAL_TABLET | Freq: Every day | ORAL | 5 refills | Status: DC
  Filled 2023-10-12: qty 90, 90d supply, fill #0

## 2023-11-06 DIAGNOSIS — M799 Soft tissue disorder, unspecified: Secondary | ICD-10-CM | POA: Diagnosis not present

## 2023-11-06 DIAGNOSIS — M25511 Pain in right shoulder: Secondary | ICD-10-CM | POA: Diagnosis not present

## 2023-11-06 DIAGNOSIS — M25611 Stiffness of right shoulder, not elsewhere classified: Secondary | ICD-10-CM | POA: Diagnosis not present

## 2023-11-06 DIAGNOSIS — M6281 Muscle weakness (generalized): Secondary | ICD-10-CM | POA: Diagnosis not present

## 2023-11-11 DIAGNOSIS — M25511 Pain in right shoulder: Secondary | ICD-10-CM | POA: Diagnosis not present

## 2023-11-12 ENCOUNTER — Other Ambulatory Visit (HOSPITAL_COMMUNITY): Payer: Self-pay

## 2023-11-12 MED ORDER — ATORVASTATIN CALCIUM 10 MG PO TABS
10.0000 mg | ORAL_TABLET | Freq: Every day | ORAL | 2 refills | Status: AC
  Filled 2023-11-12: qty 30, 30d supply, fill #0

## 2023-11-19 ENCOUNTER — Other Ambulatory Visit (HOSPITAL_COMMUNITY): Payer: Self-pay

## 2023-12-09 DIAGNOSIS — E669 Obesity, unspecified: Secondary | ICD-10-CM | POA: Diagnosis not present

## 2023-12-09 DIAGNOSIS — R7303 Prediabetes: Secondary | ICD-10-CM | POA: Diagnosis not present

## 2023-12-09 DIAGNOSIS — I10 Essential (primary) hypertension: Secondary | ICD-10-CM | POA: Diagnosis not present

## 2023-12-09 DIAGNOSIS — Z125 Encounter for screening for malignant neoplasm of prostate: Secondary | ICD-10-CM | POA: Diagnosis not present

## 2023-12-09 DIAGNOSIS — E7800 Pure hypercholesterolemia, unspecified: Secondary | ICD-10-CM | POA: Diagnosis not present

## 2023-12-09 DIAGNOSIS — Z0000 Encounter for general adult medical examination without abnormal findings: Secondary | ICD-10-CM | POA: Diagnosis not present

## 2023-12-28 ENCOUNTER — Other Ambulatory Visit (HOSPITAL_COMMUNITY): Payer: Self-pay

## 2023-12-28 MED ORDER — ATORVASTATIN CALCIUM 10 MG PO TABS
10.0000 mg | ORAL_TABLET | Freq: Every day | ORAL | 3 refills | Status: AC
  Filled 2023-12-28 – 2024-01-08 (×2): qty 90, 90d supply, fill #0

## 2024-01-07 ENCOUNTER — Other Ambulatory Visit (HOSPITAL_COMMUNITY): Payer: Self-pay

## 2024-01-08 ENCOUNTER — Other Ambulatory Visit (HOSPITAL_COMMUNITY): Payer: Self-pay

## 2024-01-12 ENCOUNTER — Other Ambulatory Visit: Payer: Self-pay

## 2024-01-12 ENCOUNTER — Other Ambulatory Visit (HOSPITAL_COMMUNITY): Payer: Self-pay

## 2024-01-12 ENCOUNTER — Other Ambulatory Visit: Payer: Self-pay | Admitting: *Deleted

## 2024-01-12 MED ORDER — EPINEPHRINE 0.3 MG/0.3ML IJ SOAJ
0.3000 mg | INTRAMUSCULAR | 1 refills | Status: DC | PRN
  Filled 2024-01-12 – 2024-01-26 (×2): qty 2, 2d supply, fill #0

## 2024-01-13 ENCOUNTER — Other Ambulatory Visit: Payer: Self-pay

## 2024-01-18 ENCOUNTER — Other Ambulatory Visit: Payer: Self-pay

## 2024-01-26 ENCOUNTER — Other Ambulatory Visit (HOSPITAL_COMMUNITY): Payer: Self-pay

## 2024-02-02 ENCOUNTER — Ambulatory Visit (INDEPENDENT_AMBULATORY_CARE_PROVIDER_SITE_OTHER): Payer: BC Managed Care – PPO

## 2024-02-02 DIAGNOSIS — J309 Allergic rhinitis, unspecified: Secondary | ICD-10-CM | POA: Diagnosis not present

## 2024-02-02 NOTE — Progress Notes (Unsigned)
 Immunotherapy   Patient Details  Name: Devin Osborne MRN: [id number] Date of Birth: Dec 01, 1981  02/02/2024  Devin Osborne started injections for Pollen and Mold. Patient received 0.05 out of both blue vials with an expiration of 04/06/2024. Patient waited 30 minutes with no problems.  Following schedule: B  Frequency:2 times per week Epi-Pen:Epi-Pen Available  Consent signed and patient instructions given.   [name] 02/02/2024, 1:31 PM

## 2024-02-08 ENCOUNTER — Ambulatory Visit (INDEPENDENT_AMBULATORY_CARE_PROVIDER_SITE_OTHER): Payer: Self-pay

## 2024-02-08 DIAGNOSIS — J309 Allergic rhinitis, unspecified: Secondary | ICD-10-CM

## 2024-02-12 ENCOUNTER — Ambulatory Visit (INDEPENDENT_AMBULATORY_CARE_PROVIDER_SITE_OTHER): Payer: Self-pay | Admitting: *Deleted

## 2024-02-12 DIAGNOSIS — J309 Allergic rhinitis, unspecified: Secondary | ICD-10-CM | POA: Diagnosis not present

## 2024-02-15 ENCOUNTER — Ambulatory Visit (INDEPENDENT_AMBULATORY_CARE_PROVIDER_SITE_OTHER): Payer: Self-pay | Admitting: *Deleted

## 2024-02-15 DIAGNOSIS — J309 Allergic rhinitis, unspecified: Secondary | ICD-10-CM

## 2024-02-17 ENCOUNTER — Ambulatory Visit (INDEPENDENT_AMBULATORY_CARE_PROVIDER_SITE_OTHER): Payer: Self-pay | Admitting: *Deleted

## 2024-02-17 DIAGNOSIS — J309 Allergic rhinitis, unspecified: Secondary | ICD-10-CM | POA: Diagnosis not present

## 2024-02-22 ENCOUNTER — Ambulatory Visit (INDEPENDENT_AMBULATORY_CARE_PROVIDER_SITE_OTHER): Payer: Self-pay

## 2024-02-22 DIAGNOSIS — J309 Allergic rhinitis, unspecified: Secondary | ICD-10-CM | POA: Diagnosis not present

## 2024-02-24 ENCOUNTER — Ambulatory Visit (INDEPENDENT_AMBULATORY_CARE_PROVIDER_SITE_OTHER): Payer: Self-pay | Admitting: *Deleted

## 2024-02-24 DIAGNOSIS — J309 Allergic rhinitis, unspecified: Secondary | ICD-10-CM | POA: Diagnosis not present

## 2024-02-29 ENCOUNTER — Ambulatory Visit (INDEPENDENT_AMBULATORY_CARE_PROVIDER_SITE_OTHER): Admitting: *Deleted

## 2024-02-29 DIAGNOSIS — J309 Allergic rhinitis, unspecified: Secondary | ICD-10-CM

## 2024-03-02 ENCOUNTER — Ambulatory Visit (INDEPENDENT_AMBULATORY_CARE_PROVIDER_SITE_OTHER): Payer: Self-pay | Admitting: *Deleted

## 2024-03-02 DIAGNOSIS — J309 Allergic rhinitis, unspecified: Secondary | ICD-10-CM

## 2024-03-07 ENCOUNTER — Ambulatory Visit (INDEPENDENT_AMBULATORY_CARE_PROVIDER_SITE_OTHER): Payer: Self-pay

## 2024-03-07 DIAGNOSIS — J309 Allergic rhinitis, unspecified: Secondary | ICD-10-CM

## 2024-03-09 ENCOUNTER — Ambulatory Visit (INDEPENDENT_AMBULATORY_CARE_PROVIDER_SITE_OTHER): Payer: Self-pay

## 2024-03-09 DIAGNOSIS — J309 Allergic rhinitis, unspecified: Secondary | ICD-10-CM | POA: Diagnosis not present

## 2024-03-14 ENCOUNTER — Ambulatory Visit (INDEPENDENT_AMBULATORY_CARE_PROVIDER_SITE_OTHER): Payer: Self-pay

## 2024-03-14 DIAGNOSIS — J309 Allergic rhinitis, unspecified: Secondary | ICD-10-CM

## 2024-03-14 NOTE — Progress Notes (Signed)
 EXP 03/14/24

## 2024-03-15 DIAGNOSIS — J301 Allergic rhinitis due to pollen: Secondary | ICD-10-CM | POA: Diagnosis not present

## 2024-03-16 ENCOUNTER — Ambulatory Visit (INDEPENDENT_AMBULATORY_CARE_PROVIDER_SITE_OTHER): Payer: Self-pay

## 2024-03-16 DIAGNOSIS — J309 Allergic rhinitis, unspecified: Secondary | ICD-10-CM | POA: Diagnosis not present

## 2024-03-21 ENCOUNTER — Telehealth: Payer: Self-pay | Admitting: Allergy

## 2024-03-21 ENCOUNTER — Other Ambulatory Visit: Payer: Self-pay

## 2024-03-21 ENCOUNTER — Ambulatory Visit (INDEPENDENT_AMBULATORY_CARE_PROVIDER_SITE_OTHER): Payer: Self-pay

## 2024-03-21 DIAGNOSIS — J309 Allergic rhinitis, unspecified: Secondary | ICD-10-CM

## 2024-03-21 MED ORDER — FLUTICASONE PROPIONATE 50 MCG/ACT NA SUSP: 2.0000 | Freq: Every day | NASAL | 5 refills | Status: DC | PRN

## 2024-03-21 NOTE — Telephone Encounter (Signed)
 Devin Osborne would like refills of Flonase  sent to [facility].

## 2024-03-23 ENCOUNTER — Ambulatory Visit (INDEPENDENT_AMBULATORY_CARE_PROVIDER_SITE_OTHER): Payer: Self-pay

## 2024-03-23 DIAGNOSIS — J309 Allergic rhinitis, unspecified: Secondary | ICD-10-CM | POA: Diagnosis not present

## 2024-03-28 ENCOUNTER — Ambulatory Visit (INDEPENDENT_AMBULATORY_CARE_PROVIDER_SITE_OTHER): Payer: Self-pay

## 2024-03-28 DIAGNOSIS — J309 Allergic rhinitis, unspecified: Secondary | ICD-10-CM | POA: Diagnosis not present

## 2024-03-30 ENCOUNTER — Ambulatory Visit (INDEPENDENT_AMBULATORY_CARE_PROVIDER_SITE_OTHER)

## 2024-03-30 DIAGNOSIS — J309 Allergic rhinitis, unspecified: Secondary | ICD-10-CM

## 2024-04-04 ENCOUNTER — Ambulatory Visit (INDEPENDENT_AMBULATORY_CARE_PROVIDER_SITE_OTHER): Payer: Self-pay

## 2024-04-04 DIAGNOSIS — J309 Allergic rhinitis, unspecified: Secondary | ICD-10-CM

## 2024-04-05 ENCOUNTER — Ambulatory Visit (INDEPENDENT_AMBULATORY_CARE_PROVIDER_SITE_OTHER): Payer: 59 | Admitting: Internal Medicine

## 2024-04-06 ENCOUNTER — Ambulatory Visit (INDEPENDENT_AMBULATORY_CARE_PROVIDER_SITE_OTHER): Payer: Self-pay

## 2024-04-06 DIAGNOSIS — J309 Allergic rhinitis, unspecified: Secondary | ICD-10-CM | POA: Diagnosis not present

## 2024-04-11 ENCOUNTER — Ambulatory Visit (INDEPENDENT_AMBULATORY_CARE_PROVIDER_SITE_OTHER): Payer: Self-pay

## 2024-04-11 DIAGNOSIS — J309 Allergic rhinitis, unspecified: Secondary | ICD-10-CM | POA: Diagnosis not present

## 2024-04-13 ENCOUNTER — Ambulatory Visit: Payer: Self-pay

## 2024-04-18 ENCOUNTER — Ambulatory Visit (INDEPENDENT_AMBULATORY_CARE_PROVIDER_SITE_OTHER): Payer: Self-pay

## 2024-04-18 DIAGNOSIS — J309 Allergic rhinitis, unspecified: Secondary | ICD-10-CM | POA: Diagnosis not present

## 2024-04-20 ENCOUNTER — Other Ambulatory Visit: Payer: Self-pay | Admitting: Allergy

## 2024-04-20 NOTE — Telephone Encounter (Signed)
 Refill for montelukast  10 mg sent to [facility] x 90 day supply with 1 refill.

## 2024-04-29 ENCOUNTER — Ambulatory Visit (INDEPENDENT_AMBULATORY_CARE_PROVIDER_SITE_OTHER): Payer: Self-pay

## 2024-04-29 DIAGNOSIS — J309 Allergic rhinitis, unspecified: Secondary | ICD-10-CM

## 2024-05-04 ENCOUNTER — Ambulatory Visit (INDEPENDENT_AMBULATORY_CARE_PROVIDER_SITE_OTHER): Payer: Self-pay

## 2024-05-04 DIAGNOSIS — J309 Allergic rhinitis, unspecified: Secondary | ICD-10-CM

## 2024-05-09 ENCOUNTER — Telehealth: Payer: Self-pay | Admitting: Allergy

## 2024-05-09 ENCOUNTER — Other Ambulatory Visit: Payer: Self-pay

## 2024-05-09 ENCOUNTER — Other Ambulatory Visit (HOSPITAL_COMMUNITY): Payer: Self-pay

## 2024-05-09 ENCOUNTER — Ambulatory Visit (INDEPENDENT_AMBULATORY_CARE_PROVIDER_SITE_OTHER): Payer: Self-pay

## 2024-05-09 DIAGNOSIS — J309 Allergic rhinitis, unspecified: Secondary | ICD-10-CM | POA: Diagnosis not present

## 2024-05-09 MED ORDER — MONTELUKAST SODIUM 10 MG PO TABS
10.0000 mg | ORAL_TABLET | Freq: Every day | ORAL | 0 refills | Status: DC
  Filled 2024-05-09: qty 90, 90d supply, fill #0

## 2024-05-09 MED ORDER — LEVOCETIRIZINE DIHYDROCHLORIDE 5 MG PO TABS
5.0000 mg | ORAL_TABLET | Freq: Every evening | ORAL | 0 refills | Status: DC
  Filled 2024-05-09: qty 30, 30d supply, fill #0

## 2024-05-09 NOTE — Telephone Encounter (Signed)
 I called the patient and informed xyzal  and singulair  have sent in. I informed to keep upcoming appt.

## 2024-05-09 NOTE — Telephone Encounter (Signed)
 Devin Osborne came into the office let us  know that he will be in need of more of his Xyzal  and his Singulair  prescriptions before he can be seen at his next scheduled office visit. He is asking that one more refill of both of these medications be called in for him to get him through.

## 2024-05-18 DIAGNOSIS — K625 Hemorrhage of anus and rectum: Secondary | ICD-10-CM | POA: Diagnosis not present

## 2024-05-18 DIAGNOSIS — K580 Irritable bowel syndrome with diarrhea: Secondary | ICD-10-CM | POA: Diagnosis not present

## 2024-05-23 ENCOUNTER — Ambulatory Visit (INDEPENDENT_AMBULATORY_CARE_PROVIDER_SITE_OTHER): Payer: Self-pay

## 2024-05-23 DIAGNOSIS — J309 Allergic rhinitis, unspecified: Secondary | ICD-10-CM

## 2024-05-30 ENCOUNTER — Ambulatory Visit (INDEPENDENT_AMBULATORY_CARE_PROVIDER_SITE_OTHER)

## 2024-05-30 DIAGNOSIS — J309 Allergic rhinitis, unspecified: Secondary | ICD-10-CM

## 2024-06-06 ENCOUNTER — Other Ambulatory Visit (HOSPITAL_COMMUNITY): Payer: Self-pay

## 2024-06-06 MED ORDER — NA SULFATE-K SULFATE-MG SULF 17.5-3.13-1.6 GM/177ML PO SOLN
177.0000 mL | ORAL | 0 refills | Status: AC
  Filled 2024-06-06: qty 354, 2d supply, fill #0

## 2024-06-08 ENCOUNTER — Ambulatory Visit (INDEPENDENT_AMBULATORY_CARE_PROVIDER_SITE_OTHER)

## 2024-06-08 DIAGNOSIS — J309 Allergic rhinitis, unspecified: Secondary | ICD-10-CM

## 2024-06-16 DIAGNOSIS — K921 Melena: Secondary | ICD-10-CM | POA: Diagnosis not present

## 2024-06-16 DIAGNOSIS — K625 Hemorrhage of anus and rectum: Secondary | ICD-10-CM | POA: Diagnosis not present

## 2024-06-17 ENCOUNTER — Ambulatory Visit

## 2024-06-17 DIAGNOSIS — J309 Allergic rhinitis, unspecified: Secondary | ICD-10-CM

## 2024-06-23 ENCOUNTER — Ambulatory Visit (INDEPENDENT_AMBULATORY_CARE_PROVIDER_SITE_OTHER)

## 2024-06-23 DIAGNOSIS — J309 Allergic rhinitis, unspecified: Secondary | ICD-10-CM

## 2024-06-30 ENCOUNTER — Ambulatory Visit (INDEPENDENT_AMBULATORY_CARE_PROVIDER_SITE_OTHER)

## 2024-06-30 DIAGNOSIS — J309 Allergic rhinitis, unspecified: Secondary | ICD-10-CM | POA: Diagnosis not present

## 2024-07-06 ENCOUNTER — Ambulatory Visit (INDEPENDENT_AMBULATORY_CARE_PROVIDER_SITE_OTHER)

## 2024-07-06 DIAGNOSIS — J309 Allergic rhinitis, unspecified: Secondary | ICD-10-CM

## 2024-07-14 ENCOUNTER — Telehealth: Payer: Self-pay | Admitting: Podiatry

## 2024-07-14 NOTE — Telephone Encounter (Signed)
 Received vm from [place] from [facility] and he stated pt was having surgery on 09/28/24 and he is needing cpt code and price estimate for them.  Upon checking pt is scheduled to come in for corn removal if you could please call him with the cpt code and price estimate. Jordan's # is [phone]

## 2024-07-18 NOTE — Progress Notes (Signed)
 VIALS MADE ON 07/18/24

## 2024-07-19 DIAGNOSIS — J301 Allergic rhinitis due to pollen: Secondary | ICD-10-CM | POA: Diagnosis not present

## 2024-07-19 DIAGNOSIS — J302 Other seasonal allergic rhinitis: Secondary | ICD-10-CM | POA: Diagnosis not present

## 2024-07-20 ENCOUNTER — Ambulatory Visit (INDEPENDENT_AMBULATORY_CARE_PROVIDER_SITE_OTHER)

## 2024-07-20 ENCOUNTER — Ambulatory Visit: Admitting: Allergy

## 2024-07-20 DIAGNOSIS — J309 Allergic rhinitis, unspecified: Secondary | ICD-10-CM | POA: Diagnosis not present

## 2024-07-22 ENCOUNTER — Ambulatory Visit: Admitting: Allergy

## 2024-07-22 ENCOUNTER — Other Ambulatory Visit: Payer: Self-pay

## 2024-07-22 ENCOUNTER — Other Ambulatory Visit (HOSPITAL_COMMUNITY): Payer: Self-pay

## 2024-07-22 ENCOUNTER — Encounter: Payer: Self-pay | Admitting: Allergy

## 2024-07-22 VITALS — BP 128/90 | HR 71 | Temp 98.1°F | Resp 16 | Ht 73.0 in | Wt 280.0 lb

## 2024-07-22 DIAGNOSIS — H1013 Acute atopic conjunctivitis, bilateral: Secondary | ICD-10-CM | POA: Diagnosis not present

## 2024-07-22 DIAGNOSIS — L2089 Other atopic dermatitis: Secondary | ICD-10-CM | POA: Diagnosis not present

## 2024-07-22 DIAGNOSIS — T781XXD Other adverse food reactions, not elsewhere classified, subsequent encounter: Secondary | ICD-10-CM | POA: Diagnosis not present

## 2024-07-22 DIAGNOSIS — K9049 Malabsorption due to intolerance, not elsewhere classified: Secondary | ICD-10-CM

## 2024-07-22 DIAGNOSIS — J3089 Other allergic rhinitis: Secondary | ICD-10-CM

## 2024-07-22 MED ORDER — MONTELUKAST SODIUM 10 MG PO TABS
10.0000 mg | ORAL_TABLET | Freq: Every day | ORAL | 3 refills | Status: DC
  Filled 2024-07-22: qty 90, 90d supply, fill #0

## 2024-07-22 MED ORDER — NEFFY 2 MG/0.1ML NA SOLN: 1.0000 | NASAL | 1 refills | Status: AC | PRN

## 2024-07-22 MED ORDER — FLUTICASONE PROPIONATE 50 MCG/ACT NA SUSP
2.0000 | Freq: Every day | NASAL | 11 refills | Status: AC | PRN
  Filled 2024-07-22: qty 16, 30d supply, fill #0

## 2024-07-22 MED ORDER — OLOPATADINE HCL 0.1 % OP SOLN
1.0000 [drp] | OPHTHALMIC | 11 refills | Status: AC | PRN
  Filled 2024-07-22: qty 5, 30d supply, fill #0

## 2024-07-22 MED ORDER — LEVOCETIRIZINE DIHYDROCHLORIDE 5 MG PO TABS
5.0000 mg | ORAL_TABLET | Freq: Every evening | ORAL | 3 refills | Status: DC
  Filled 2024-07-22: qty 30, 30d supply, fill #0

## 2024-07-22 MED ORDER — IPRATROPIUM BROMIDE 0.03 % NA SOLN
2.0000 | Freq: Two times a day (BID) | NASAL | 11 refills | Status: AC | PRN
  Filled 2024-07-22: qty 30, 42d supply, fill #0
  Filled 2024-08-24: qty 30, 75d supply, fill #0

## 2024-07-22 MED ORDER — TRIAMCINOLONE ACETONIDE 0.1 % EX OINT
1.0000 | TOPICAL_OINTMENT | Freq: Two times a day (BID) | CUTANEOUS | 2 refills | Status: AC
Start: 2024-07-22 — End: ?
  Filled 2024-07-22: qty 454, 90d supply, fill #0

## 2024-07-22 NOTE — Patient Instructions (Addendum)
 Allergic rhinitis with conjunctivitis (positive to grass pollen, tree pollen, molds) - continue allergen avoidance measures  - continue Xyzal  5mg  daily.  Can increase to twice a day as needed to help with itching.  - continue fluticasone  nasal spray using 1 to 2 sprays each nostril once a day as needed for stuffy nose.   - continue ipratropium bromide  nasal spray 0.03% 2 sprays each nostril twice a day as needed for runny nose/drainage down the throat.   - continue singulair  10mg  daily at bedtime - for itchy/watery eyes recommend use of Pataday  1 drop each eye daily as needed - continue allergy shots per protocol.  You are in the maintenance vial!  Bring your epipen  device on your allergy shot days.  Take antihistamine on your allergy shot days.   Eczema - continue moisturization after bathing.  Some good moisturizing options are Eucerin cream, Cetaphil, or CeraVe.  - for dry, itchy, irritated, patchy, flaky or scaly areas can use Triamcinolone  ointment 0.1% twice a day as needed.  This is to be used as needed on the body - for itchy, dry, irritated, patchy, flaky or scaly areas can use non-steroid ointment, Elidel .  This can be used anywhere on the body including armpits - keep fingernails trimmed  Oral allergy syndrome - oral allergy syndrome (OAS) or pollen-food allergy syndrome (PFAS) is a relatively common form of food allergy, particularly in adults. It typically occurs in people who have pollen allergies when the immune system sees proteins on the food that look like proteins on the pollen. This results in the allergy antibody (IgE) binding to the food instead of the pollen. Patients typically report itching and/or mild swelling of the mouth and throat immediately following ingestion of certain uncooked fruits (including nuts) or raw vegetables. Only a very small number of affected individuals experience systemic allergic reactions, such as anaphylaxis which occurs with true food allergies.   -this is most likely what is happening when you eat certain nuts  Food intolerance - continue to avoid gluten products to prevent symptoms   Follow-up in 6-12 months or sooner if needed

## 2024-07-22 NOTE — Progress Notes (Signed)
 Follow-up Note  RE: NYAIR DEPAULO MRN: [id number] DOB: 02/21/1981 Date of Office Visit: 07/22/2024   History of present illness: Devin Osborne is a 43 y.o. male presenting today for follow-up of allergic rhinitis with conjunctivitis, OAS and eczema.  He was last seen in the office on 10/02/23 by myself.  Discussed the use of AI scribe software for clinical note transcription with the patient, who gave verbal consent to proceed.  He is currently in the maintenance phase of allergy shots and notes significant improvement in symptoms compared to previous years. This spring and summer were less severe than past seasons. Last year, he experienced intense symptoms during March, April, and May, but this year was much better.  He receives allergy shots bi-weekly. He experiences typical post-injection itching at the injection site, which resolve by the next day.  He has access to an epinephrine  device.  He uses Xyzal  for allergy management, typically taking one tablet daily, but occasionally requires two tablets. He has been able to skip doses without noticing a difference in symptoms. He also uses nasal sprays a few times a week as needed and takes Singulair  regularly.  He has a history of oral allergy syndrome, particularly with pineapple, but recently noted that consuming pineapple did not cause the usual burning sensation.  He avoids gluten for the most part and has made significant dietary changes. He is more physically active than before.     Review of systems: 10pt ROS negative unless noted above in HPI   Past medical/social/surgical/family history have been reviewed and are unchanged unless specifically indicated below.  No changes  Medication List: Current Outpatient Medications  Medication Sig Dispense Refill   EPINEPHrine  (EPIPEN  2-PAK) 0.3 mg/0.3 mL IJ SOAJ injection Inject 0.3 mg into the muscle as needed for anaphylaxis. 2 each 1   fluticasone  (FLONASE ) 50 MCG/ACT  nasal spray Place 2 sprays into both nostrils daily as needed for stuffy nose 16 g 5   ipratropium (ATROVENT ) 0.03 % nasal spray Place 2 sprays into both nostrils 2 (two) times daily as needed for runny nose/drainage down throat 30 mL 5   ketoconazole  (NIZORAL ) 2 % cream Apply 1 Application topically daily. 60 g 2   levocetirizine (XYZAL ) 5 MG tablet Take 1 tablet (5 mg total) by mouth every evening. 90 tablet 0   montelukast  (SINGULAIR ) 10 MG tablet Take 1 tablet (10 mg total) by mouth at bedtime. 90 tablet 0   pimecrolimus  (ELIDEL ) 1 % cream Apply topically 2 (two) times daily. 30 g 5   triamcinolone  ointment (KENALOG ) 0.1 % Apply 1 Application topically 2 (two) times daily. 45 g 3   atorvastatin  (LIPITOR) 10 MG tablet Take 1 tablet (10 mg total) by mouth daily. (Patient not taking: Reported on 07/22/2024) 30 tablet 2   atorvastatin  (LIPITOR) 10 MG tablet Take 1 tablet (10 mg total) by mouth daily. (Patient not taking: Reported on 07/22/2024) 90 tablet 3   dicyclomine  (BENTYL ) 20 MG tablet Take 1 tablet (20 mg total) by mouth 3 (three) times daily as needed for abdominal cramp for 7 days (Patient not taking: Reported on 07/22/2024) 21 tablet 0   ergocalciferol  (VITAMIN D2) 1.25 MG (50000 UT) capsule Take 1 capsule (50,000 Units total) by mouth once a week. (Patient not taking: Reported on 07/22/2024) 12 capsule 0   losartan  (COZAAR ) 50 MG tablet Take 1 tablet (50 mg total) by mouth daily. (Patient not taking: Reported on 07/22/2024) 90 tablet 5   polyethylene glycol  powder (GLYCOLAX /MIRALAX ) 17 GM/SCOOP powder Take 17 g by mouth 2 (two) times daily as needed. (Patient not taking: Reported on 07/22/2024) 3350 g 1   rosuvastatin  (CRESTOR ) 5 MG tablet Take 1 tablet (5 mg total) by mouth daily. NEEDS APPT/LABS (Patient not taking: Reported on 07/22/2024) 90 tablet 0   rosuvastatin  (CRESTOR ) 5 MG tablet Take 1 tablet (5 mg total) by mouth daily. (Patient not taking: Reported on 07/22/2024) 90 tablet 5   No  current facility-administered medications for this visit.     Known medication allergies: Allergies  Allergen Reactions   Other Other (See Comments)     Physical examination: Blood pressure (!) 128/90, pulse 71, temperature 98.1 F (36.7 C), temperature source Temporal, resp. rate 16, height 6' 1 (1.854 m), weight 280 lb (127 kg), SpO2 100%.  General: Alert, interactive, in no acute distress. HEENT: PERRLA, TMs pearly gray, turbinates minimally edematous without discharge, post-pharynx non erythematous. Neck: Supple without lymphadenopathy. Lungs: Clear to auscultation without wheezing, rhonchi or rales. {no increased work of breathing. CV: Normal S1, S2 without murmurs. Abdomen: Nondistended, nontender. Skin: Warm and dry, without lesions or rashes. Extremities:  No clubbing, cyanosis or edema. Neuro:   Grossly intact.  Diagnostics/Labs: None today  Assessment and plan:   Allergic rhinitis with conjunctivitis (positive to grass pollen, tree pollen, molds) - continue allergen avoidance measures  - continue Xyzal  5mg  daily.  Can increase to twice a day as needed to help with itching.  - continue fluticasone  nasal spray using 1 to 2 sprays each nostril once a day as needed for stuffy nose.   - continue ipratropium bromide  nasal spray 0.03% 2 sprays each nostril twice a day as needed for runny nose/drainage down the throat.   - continue singulair  10mg  daily at bedtime - for itchy/watery eyes recommend use of Pataday  1 drop each eye daily as needed - continue allergy shots per protocol.  You are in the maintenance vial!  Bring your epipen  device on your allergy shot days.  Take antihistamine on your allergy shot days.   Eczema - continue moisturization after bathing.  Some good moisturizing options are Eucerin cream, Cetaphil, or CeraVe.  - for dry, itchy, irritated, patchy, flaky or scaly areas can use Triamcinolone  ointment 0.1% twice a day as needed.  This is to be used as  needed on the body - for itchy, dry, irritated, patchy, flaky or scaly areas can use non-steroid ointment, Elidel .  This can be used anywhere on the body including armpits - keep fingernails trimmed  Oral allergy syndrome - oral allergy syndrome (OAS) or pollen-food allergy syndrome (PFAS) is a relatively common form of food allergy, particularly in adults. It typically occurs in people who have pollen allergies when the immune system sees proteins on the food that look like proteins on the pollen. This results in the allergy antibody (IgE) binding to the food instead of the pollen. Patients typically report itching and/or mild swelling of the mouth and throat immediately following ingestion of certain uncooked fruits (including nuts) or raw vegetables. Only a very small number of affected individuals experience systemic allergic reactions, such as anaphylaxis which occurs with true food allergies.  -this is most likely what is happening when you eat certain nuts  Food intolerance - continue to avoid gluten products to prevent symptoms   Follow-up in 6-12 months or sooner if needed   I appreciate the opportunity to take part in Shedrick's care. Please do not hesitate to contact me with questions.  Sincerely,   Danita Brain, MD Allergy/Immunology [facility]

## 2024-07-25 ENCOUNTER — Other Ambulatory Visit (HOSPITAL_COMMUNITY): Payer: Self-pay

## 2024-08-02 ENCOUNTER — Other Ambulatory Visit (HOSPITAL_COMMUNITY): Payer: Self-pay

## 2024-08-05 ENCOUNTER — Ambulatory Visit (INDEPENDENT_AMBULATORY_CARE_PROVIDER_SITE_OTHER)

## 2024-08-05 DIAGNOSIS — J309 Allergic rhinitis, unspecified: Secondary | ICD-10-CM

## 2024-08-09 ENCOUNTER — Telehealth: Payer: Self-pay

## 2024-08-09 ENCOUNTER — Other Ambulatory Visit (HOSPITAL_COMMUNITY): Payer: Self-pay

## 2024-08-09 ENCOUNTER — Other Ambulatory Visit: Payer: Self-pay

## 2024-08-09 MED ORDER — EPINEPHRINE 0.3 MG/0.3ML IJ SOAJ
0.3000 mg | INTRAMUSCULAR | 1 refills | Status: AC | PRN
  Filled 2024-08-09: qty 2, 14d supply, fill #0

## 2024-08-09 NOTE — Telephone Encounter (Signed)
 Changed to epi under Dr Jeneal. He has had it in past

## 2024-08-09 NOTE — Telephone Encounter (Signed)
*  AA  Pharmacy Patient Advocate Encounter   Received notification from CoverMyMeds that prior authorization for Neffy  2MG /0.1ML solution  is required/requested.   Insurance verification completed.   The patient is insured through [employer] .   Per test claim:  Injectable Epinephrine  is preferred by the insurance.  If suggested medication is appropriate, Please send in a new RX and discontinue this one. If not, please advise as to why it's not appropriate so that we may request a Prior Authorization. Please note, some preferred medications may still require a PA.  If the suggested medications have not been trialed and there are no contraindications to their use, the PA will not be submitted, as it will not be approved.

## 2024-08-10 ENCOUNTER — Ambulatory Visit (INDEPENDENT_AMBULATORY_CARE_PROVIDER_SITE_OTHER)

## 2024-08-10 ENCOUNTER — Other Ambulatory Visit (HOSPITAL_COMMUNITY): Payer: Self-pay

## 2024-08-10 ENCOUNTER — Ambulatory Visit (INDEPENDENT_AMBULATORY_CARE_PROVIDER_SITE_OTHER): Admitting: Podiatry

## 2024-08-10 DIAGNOSIS — M2042 Other hammer toe(s) (acquired), left foot: Secondary | ICD-10-CM

## 2024-08-10 DIAGNOSIS — M79672 Pain in left foot: Secondary | ICD-10-CM

## 2024-08-10 NOTE — Progress Notes (Signed)
 Patient presents complaining of a callus and pain in the fifth toe left.  Pain can wax and wane with little more painful little sometimes.  Right analysis little bit sore he does use some over-the-counter salicylic acid compound on it.  Works in the [employer].  He is on his feet a lot.   Physical exam:  General appearance: Pleasant, and in no acute distress. AOx3.  Vascular: Pedal pulses: DP 2/4 bilaterally, PT 2/4 bilaterally. No edema lower legs bilaterally. Capillary fill time immediate bilaterally.  Neurological: Light touch intact feet bilaterally.  Normal Achilles reflex bilaterally.  No clonus or spasticity noted.  Dermatologic:   Skin normal temperature bilaterally.  Skin normal color, tone, and texture bilaterally.   Musculoskeletal: Hammertoe fifth toe left.  Some tenderness over the PIPJ of the fifth toe.  Normal muscle strength lower extremity bilaterally  Radiographs: 3 views foot left:  Hammertoe fifth toe.  No signs of any fractures or dislocations.  No evidence of bone tumors.  Normal bone density.  Diagnosis: 1.  Pain left foot. 2.  Hammertoe fifth toe left.  Plan: -New patient office visit for evaluation and management level 3. - Discussed on the hammertoe and conservative or surgical treatment.  Recommended shoes that he could wear to take some the pressure off it.  Also recommend well cushioned socks.  I told him he can use the over-the-counter compound however use with caution and if it starts getting sore he should stop using it.  Will try silicone sleeve first and see how that works.  Also today we debrided the hyperkeratosis on the fifth toe. -Dispensed silicone sleeve for fifth toe left  Return 6 weeks follow-up hammertoe fifth toe left

## 2024-08-11 ENCOUNTER — Ambulatory Visit (INDEPENDENT_AMBULATORY_CARE_PROVIDER_SITE_OTHER)

## 2024-08-11 DIAGNOSIS — J309 Allergic rhinitis, unspecified: Secondary | ICD-10-CM | POA: Diagnosis not present

## 2024-08-17 ENCOUNTER — Ambulatory Visit (INDEPENDENT_AMBULATORY_CARE_PROVIDER_SITE_OTHER)

## 2024-08-17 DIAGNOSIS — J309 Allergic rhinitis, unspecified: Secondary | ICD-10-CM | POA: Diagnosis not present

## 2024-08-18 ENCOUNTER — Other Ambulatory Visit (HOSPITAL_COMMUNITY): Payer: Self-pay

## 2024-08-24 ENCOUNTER — Ambulatory Visit (INDEPENDENT_AMBULATORY_CARE_PROVIDER_SITE_OTHER): Admitting: Podiatry

## 2024-08-24 ENCOUNTER — Other Ambulatory Visit (HOSPITAL_COMMUNITY): Payer: Self-pay

## 2024-08-24 ENCOUNTER — Other Ambulatory Visit: Payer: Self-pay

## 2024-08-24 DIAGNOSIS — R61 Generalized hyperhidrosis: Secondary | ICD-10-CM | POA: Diagnosis not present

## 2024-08-24 DIAGNOSIS — B353 Tinea pedis: Secondary | ICD-10-CM

## 2024-08-24 MED ORDER — DRYSOL 20 % EX SOLN
CUTANEOUS | 9 refills | Status: AC
  Filled 2024-08-24: qty 60, 30d supply, fill #0

## 2024-08-24 MED ORDER — KETOCONAZOLE 2 % EX CREA
1.0000 | TOPICAL_CREAM | Freq: Two times a day (BID) | CUTANEOUS | 6 refills | Status: AC
  Filled 2024-08-24: qty 60, 30d supply, fill #0
  Filled 2024-12-20: qty 60, 30d supply, fill #1

## 2024-08-24 NOTE — Progress Notes (Signed)
 Presents follow-up hammertoe fifth toe left.  Is been doing much better and not as painful.  Is concerned about a little maceration starting in between the 4th and 5th toes and a little bit in between the third and the fourth toes.  Says his feet also tend to sweat a lot.   Physical exam:  General appearance: Pleasant, and in no acute distress. AOx3.  Vascular: Pedal pulses: DP 2/4 bilaterally, PT 2/4 bilaterally.  No edema lower legs bilaterally. Capillary fill time.  Bilaterally.  Neurological: Grossly intact bilateral  Dermatologic:   Maceration fourth interspace digital space bilaterally.  Some redness extending dorsally and plantarly  around this.  Small amount in the third interdigital space bilaterally.  Skin normal temperature bilaterally.  Skin normal color, tone, and texture bilaterally.  Somewhat hyper hidrotic skin feet bilaterally  Musculoskeletal: Hammertoes 4th and 5th toes bilaterally.  No pain at the PIPJ fifth toe bilaterally.    Diagnosis: 1 tinea pedis bilaterally. 2.  Hyperhidrosis feet bilaterally.  Plan: -Established office visit for evaluation and management level 3. - Discussed with him the tinea pedis and the hyperhidrosis.  Will get him prescription for an antifungal to use between the toes.  I will give him some Drysol prescription to treat the hyperhidrosis.  Discussed moisture wicking socks.  Discussed alternating shoe wear. -Rx ketoconazole  cream, apply twice daily to affected areas feet, 6 refills -Rx Drysol solution, apply to feet once a day for the first week, then use twice a week thereafter, 9 refills  Return as needed

## 2024-08-25 ENCOUNTER — Other Ambulatory Visit: Payer: Self-pay

## 2024-08-25 ENCOUNTER — Other Ambulatory Visit (HOSPITAL_COMMUNITY): Payer: Self-pay

## 2024-08-26 ENCOUNTER — Other Ambulatory Visit (HOSPITAL_COMMUNITY): Payer: Self-pay

## 2024-09-07 ENCOUNTER — Ambulatory Visit: Admitting: Allergy

## 2024-09-09 ENCOUNTER — Other Ambulatory Visit: Payer: Self-pay | Admitting: Allergy

## 2024-09-19 ENCOUNTER — Ambulatory Visit

## 2024-09-19 DIAGNOSIS — J309 Allergic rhinitis, unspecified: Secondary | ICD-10-CM

## 2024-09-28 ENCOUNTER — Ambulatory Visit: Admitting: Podiatry

## 2024-09-29 DIAGNOSIS — Z23 Encounter for immunization: Secondary | ICD-10-CM | POA: Diagnosis not present

## 2024-10-13 DIAGNOSIS — M9902 Segmental and somatic dysfunction of thoracic region: Secondary | ICD-10-CM | POA: Diagnosis not present

## 2024-10-13 DIAGNOSIS — M47813 Spondylosis without myelopathy or radiculopathy, cervicothoracic region: Secondary | ICD-10-CM | POA: Diagnosis not present

## 2024-10-13 DIAGNOSIS — M9901 Segmental and somatic dysfunction of cervical region: Secondary | ICD-10-CM | POA: Diagnosis not present

## 2024-10-13 DIAGNOSIS — R293 Abnormal posture: Secondary | ICD-10-CM | POA: Diagnosis not present

## 2024-10-14 DIAGNOSIS — M9901 Segmental and somatic dysfunction of cervical region: Secondary | ICD-10-CM | POA: Diagnosis not present

## 2024-10-14 DIAGNOSIS — M9902 Segmental and somatic dysfunction of thoracic region: Secondary | ICD-10-CM | POA: Diagnosis not present

## 2024-10-14 DIAGNOSIS — R293 Abnormal posture: Secondary | ICD-10-CM | POA: Diagnosis not present

## 2024-10-14 DIAGNOSIS — M47813 Spondylosis without myelopathy or radiculopathy, cervicothoracic region: Secondary | ICD-10-CM | POA: Diagnosis not present

## 2024-10-18 DIAGNOSIS — M9901 Segmental and somatic dysfunction of cervical region: Secondary | ICD-10-CM | POA: Diagnosis not present

## 2024-10-18 DIAGNOSIS — R293 Abnormal posture: Secondary | ICD-10-CM | POA: Diagnosis not present

## 2024-10-18 DIAGNOSIS — M9902 Segmental and somatic dysfunction of thoracic region: Secondary | ICD-10-CM | POA: Diagnosis not present

## 2024-10-18 DIAGNOSIS — M47813 Spondylosis without myelopathy or radiculopathy, cervicothoracic region: Secondary | ICD-10-CM | POA: Diagnosis not present

## 2024-10-19 ENCOUNTER — Ambulatory Visit (INDEPENDENT_AMBULATORY_CARE_PROVIDER_SITE_OTHER)

## 2024-10-19 DIAGNOSIS — M9901 Segmental and somatic dysfunction of cervical region: Secondary | ICD-10-CM | POA: Diagnosis not present

## 2024-10-19 DIAGNOSIS — J309 Allergic rhinitis, unspecified: Secondary | ICD-10-CM | POA: Diagnosis not present

## 2024-10-19 DIAGNOSIS — M9902 Segmental and somatic dysfunction of thoracic region: Secondary | ICD-10-CM | POA: Diagnosis not present

## 2024-10-19 DIAGNOSIS — R293 Abnormal posture: Secondary | ICD-10-CM | POA: Diagnosis not present

## 2024-10-19 DIAGNOSIS — M47813 Spondylosis without myelopathy or radiculopathy, cervicothoracic region: Secondary | ICD-10-CM | POA: Diagnosis not present

## 2024-10-21 DIAGNOSIS — M9902 Segmental and somatic dysfunction of thoracic region: Secondary | ICD-10-CM | POA: Diagnosis not present

## 2024-10-21 DIAGNOSIS — M9901 Segmental and somatic dysfunction of cervical region: Secondary | ICD-10-CM | POA: Diagnosis not present

## 2024-10-21 DIAGNOSIS — R293 Abnormal posture: Secondary | ICD-10-CM | POA: Diagnosis not present

## 2024-10-21 DIAGNOSIS — M47813 Spondylosis without myelopathy or radiculopathy, cervicothoracic region: Secondary | ICD-10-CM | POA: Diagnosis not present

## 2024-10-25 DIAGNOSIS — R293 Abnormal posture: Secondary | ICD-10-CM | POA: Diagnosis not present

## 2024-10-25 DIAGNOSIS — M9901 Segmental and somatic dysfunction of cervical region: Secondary | ICD-10-CM | POA: Diagnosis not present

## 2024-10-25 DIAGNOSIS — M47813 Spondylosis without myelopathy or radiculopathy, cervicothoracic region: Secondary | ICD-10-CM | POA: Diagnosis not present

## 2024-10-25 DIAGNOSIS — M9902 Segmental and somatic dysfunction of thoracic region: Secondary | ICD-10-CM | POA: Diagnosis not present

## 2024-10-26 DIAGNOSIS — M9902 Segmental and somatic dysfunction of thoracic region: Secondary | ICD-10-CM | POA: Diagnosis not present

## 2024-10-26 DIAGNOSIS — M47813 Spondylosis without myelopathy or radiculopathy, cervicothoracic region: Secondary | ICD-10-CM | POA: Diagnosis not present

## 2024-10-26 DIAGNOSIS — R293 Abnormal posture: Secondary | ICD-10-CM | POA: Diagnosis not present

## 2024-10-26 DIAGNOSIS — M9901 Segmental and somatic dysfunction of cervical region: Secondary | ICD-10-CM | POA: Diagnosis not present

## 2024-11-01 DIAGNOSIS — M9902 Segmental and somatic dysfunction of thoracic region: Secondary | ICD-10-CM | POA: Diagnosis not present

## 2024-11-01 DIAGNOSIS — M9901 Segmental and somatic dysfunction of cervical region: Secondary | ICD-10-CM | POA: Diagnosis not present

## 2024-11-01 DIAGNOSIS — M47813 Spondylosis without myelopathy or radiculopathy, cervicothoracic region: Secondary | ICD-10-CM | POA: Diagnosis not present

## 2024-11-01 DIAGNOSIS — R293 Abnormal posture: Secondary | ICD-10-CM | POA: Diagnosis not present

## 2024-11-02 DIAGNOSIS — M9901 Segmental and somatic dysfunction of cervical region: Secondary | ICD-10-CM | POA: Diagnosis not present

## 2024-11-02 DIAGNOSIS — M47813 Spondylosis without myelopathy or radiculopathy, cervicothoracic region: Secondary | ICD-10-CM | POA: Diagnosis not present

## 2024-11-02 DIAGNOSIS — R293 Abnormal posture: Secondary | ICD-10-CM | POA: Diagnosis not present

## 2024-11-02 DIAGNOSIS — M9902 Segmental and somatic dysfunction of thoracic region: Secondary | ICD-10-CM | POA: Diagnosis not present

## 2024-11-09 DIAGNOSIS — M9902 Segmental and somatic dysfunction of thoracic region: Secondary | ICD-10-CM | POA: Diagnosis not present

## 2024-11-09 DIAGNOSIS — R293 Abnormal posture: Secondary | ICD-10-CM | POA: Diagnosis not present

## 2024-11-09 DIAGNOSIS — M47813 Spondylosis without myelopathy or radiculopathy, cervicothoracic region: Secondary | ICD-10-CM | POA: Diagnosis not present

## 2024-11-09 DIAGNOSIS — M9901 Segmental and somatic dysfunction of cervical region: Secondary | ICD-10-CM | POA: Diagnosis not present

## 2024-11-10 DIAGNOSIS — R293 Abnormal posture: Secondary | ICD-10-CM | POA: Diagnosis not present

## 2024-11-10 DIAGNOSIS — M47813 Spondylosis without myelopathy or radiculopathy, cervicothoracic region: Secondary | ICD-10-CM | POA: Diagnosis not present

## 2024-11-10 DIAGNOSIS — M9902 Segmental and somatic dysfunction of thoracic region: Secondary | ICD-10-CM | POA: Diagnosis not present

## 2024-11-10 DIAGNOSIS — M9901 Segmental and somatic dysfunction of cervical region: Secondary | ICD-10-CM | POA: Diagnosis not present

## 2024-11-11 DIAGNOSIS — M47813 Spondylosis without myelopathy or radiculopathy, cervicothoracic region: Secondary | ICD-10-CM | POA: Diagnosis not present

## 2024-11-11 DIAGNOSIS — R293 Abnormal posture: Secondary | ICD-10-CM | POA: Diagnosis not present

## 2024-11-11 DIAGNOSIS — M9901 Segmental and somatic dysfunction of cervical region: Secondary | ICD-10-CM | POA: Diagnosis not present

## 2024-11-11 DIAGNOSIS — M9902 Segmental and somatic dysfunction of thoracic region: Secondary | ICD-10-CM | POA: Diagnosis not present

## 2024-11-15 ENCOUNTER — Other Ambulatory Visit: Payer: Self-pay | Admitting: Allergy

## 2024-11-15 DIAGNOSIS — R293 Abnormal posture: Secondary | ICD-10-CM | POA: Diagnosis not present

## 2024-11-15 DIAGNOSIS — M47813 Spondylosis without myelopathy or radiculopathy, cervicothoracic region: Secondary | ICD-10-CM | POA: Diagnosis not present

## 2024-11-15 DIAGNOSIS — M9901 Segmental and somatic dysfunction of cervical region: Secondary | ICD-10-CM | POA: Diagnosis not present

## 2024-11-15 DIAGNOSIS — M9902 Segmental and somatic dysfunction of thoracic region: Secondary | ICD-10-CM | POA: Diagnosis not present

## 2024-11-16 ENCOUNTER — Ambulatory Visit (INDEPENDENT_AMBULATORY_CARE_PROVIDER_SITE_OTHER)

## 2024-11-16 DIAGNOSIS — J309 Allergic rhinitis, unspecified: Secondary | ICD-10-CM | POA: Diagnosis not present

## 2024-11-16 DIAGNOSIS — R293 Abnormal posture: Secondary | ICD-10-CM | POA: Diagnosis not present

## 2024-11-16 DIAGNOSIS — M9901 Segmental and somatic dysfunction of cervical region: Secondary | ICD-10-CM | POA: Diagnosis not present

## 2024-11-16 DIAGNOSIS — M9902 Segmental and somatic dysfunction of thoracic region: Secondary | ICD-10-CM | POA: Diagnosis not present

## 2024-11-16 DIAGNOSIS — M47813 Spondylosis without myelopathy or radiculopathy, cervicothoracic region: Secondary | ICD-10-CM | POA: Diagnosis not present

## 2024-11-18 DIAGNOSIS — R293 Abnormal posture: Secondary | ICD-10-CM | POA: Diagnosis not present

## 2024-11-18 DIAGNOSIS — M47813 Spondylosis without myelopathy or radiculopathy, cervicothoracic region: Secondary | ICD-10-CM | POA: Diagnosis not present

## 2024-11-18 DIAGNOSIS — M9902 Segmental and somatic dysfunction of thoracic region: Secondary | ICD-10-CM | POA: Diagnosis not present

## 2024-11-18 DIAGNOSIS — M9901 Segmental and somatic dysfunction of cervical region: Secondary | ICD-10-CM | POA: Diagnosis not present

## 2024-11-22 DIAGNOSIS — M9901 Segmental and somatic dysfunction of cervical region: Secondary | ICD-10-CM | POA: Diagnosis not present

## 2024-11-22 DIAGNOSIS — M47813 Spondylosis without myelopathy or radiculopathy, cervicothoracic region: Secondary | ICD-10-CM | POA: Diagnosis not present

## 2024-11-22 DIAGNOSIS — R293 Abnormal posture: Secondary | ICD-10-CM | POA: Diagnosis not present

## 2024-11-22 DIAGNOSIS — M9902 Segmental and somatic dysfunction of thoracic region: Secondary | ICD-10-CM | POA: Diagnosis not present

## 2024-11-28 ENCOUNTER — Other Ambulatory Visit (HOSPITAL_COMMUNITY): Payer: Self-pay

## 2024-11-28 MED ORDER — LOSARTAN POTASSIUM 50 MG PO TABS
50.0000 mg | ORAL_TABLET | Freq: Every day | ORAL | 0 refills | Status: DC
  Filled 2024-11-28: qty 30, 30d supply, fill #0

## 2024-12-20 ENCOUNTER — Other Ambulatory Visit (HOSPITAL_COMMUNITY): Payer: Self-pay

## 2024-12-21 ENCOUNTER — Other Ambulatory Visit (HOSPITAL_COMMUNITY): Payer: Self-pay

## 2024-12-21 MED ORDER — AMOXICILLIN 875 MG PO TABS
875.0000 mg | ORAL_TABLET | Freq: Two times a day (BID) | ORAL | 0 refills | Status: AC
  Filled 2024-12-21: qty 14, 7d supply, fill #0

## 2024-12-22 ENCOUNTER — Ambulatory Visit

## 2024-12-22 DIAGNOSIS — J302 Other seasonal allergic rhinitis: Secondary | ICD-10-CM

## 2025-01-05 ENCOUNTER — Other Ambulatory Visit (HOSPITAL_COMMUNITY): Payer: Self-pay

## 2025-01-05 MED ORDER — LOSARTAN POTASSIUM 50 MG PO TABS
50.0000 mg | ORAL_TABLET | Freq: Every day | ORAL | 3 refills | Status: AC
  Filled 2025-01-05: qty 90, 90d supply, fill #0

## 2025-07-21 ENCOUNTER — Ambulatory Visit: Admitting: Allergy
